# Patient Record
Sex: Male | Born: 1950 | Race: Black or African American | Hispanic: No | Smoking: Former smoker
Health system: Southern US, Community
[De-identification: ages and names within clinical notes are randomized; demographics above are authoritative.]

## PROBLEM LIST (undated history)

## (undated) DIAGNOSIS — N201 Calculus of ureter: Secondary | ICD-10-CM

## (undated) DIAGNOSIS — F419 Anxiety disorder, unspecified: Secondary | ICD-10-CM

## (undated) DIAGNOSIS — N200 Calculus of kidney: Secondary | ICD-10-CM

## (undated) DIAGNOSIS — E7800 Pure hypercholesterolemia, unspecified: Secondary | ICD-10-CM

## (undated) DIAGNOSIS — Z87442 Personal history of urinary calculi: Secondary | ICD-10-CM

## (undated) DIAGNOSIS — S42009A Fracture of unspecified part of unspecified clavicle, initial encounter for closed fracture: Secondary | ICD-10-CM

## (undated) HISTORY — DX: Calculus of kidney: N20.0

## (undated) HISTORY — DX: Anxiety disorder, unspecified: F41.9

## (undated) HISTORY — PX: TONSILLECTOMY: SUR1361

## (undated) HISTORY — DX: Fracture of unspecified part of unspecified clavicle, initial encounter for closed fracture: S42.009A

---

## 1999-07-31 ENCOUNTER — Encounter: Admission: RE | Admit: 1999-07-31 | Discharge: 1999-07-31 | Payer: Self-pay | Admitting: Urology

## 1999-07-31 ENCOUNTER — Encounter: Payer: Self-pay | Admitting: Urology

## 1999-08-06 ENCOUNTER — Encounter: Admission: RE | Admit: 1999-08-06 | Discharge: 1999-08-06 | Payer: Self-pay | Admitting: Urology

## 1999-08-06 ENCOUNTER — Encounter: Payer: Self-pay | Admitting: Urology

## 2015-01-14 ENCOUNTER — Encounter: Payer: Self-pay | Admitting: Family

## 2015-01-14 ENCOUNTER — Other Ambulatory Visit (INDEPENDENT_AMBULATORY_CARE_PROVIDER_SITE_OTHER): Payer: PPO

## 2015-01-14 ENCOUNTER — Ambulatory Visit (INDEPENDENT_AMBULATORY_CARE_PROVIDER_SITE_OTHER): Payer: PPO | Admitting: Family

## 2015-01-14 VITALS — BP 132/86 | HR 67 | Temp 98.7°F | Resp 18 | Ht 62.0 in | Wt 127.4 lb

## 2015-01-14 DIAGNOSIS — F411 Generalized anxiety disorder: Secondary | ICD-10-CM

## 2015-01-14 DIAGNOSIS — R7989 Other specified abnormal findings of blood chemistry: Secondary | ICD-10-CM | POA: Diagnosis not present

## 2015-01-14 DIAGNOSIS — Z136 Encounter for screening for cardiovascular disorders: Secondary | ICD-10-CM

## 2015-01-14 DIAGNOSIS — Z125 Encounter for screening for malignant neoplasm of prostate: Secondary | ICD-10-CM | POA: Diagnosis not present

## 2015-01-14 DIAGNOSIS — Z0000 Encounter for general adult medical examination without abnormal findings: Secondary | ICD-10-CM | POA: Diagnosis not present

## 2015-01-14 DIAGNOSIS — Z23 Encounter for immunization: Secondary | ICD-10-CM

## 2015-01-14 LAB — BASIC METABOLIC PANEL
BUN: 8 mg/dL (ref 6–23)
CO2: 31 mEq/L (ref 19–32)
Calcium: 9.7 mg/dL (ref 8.4–10.5)
Chloride: 102 mEq/L (ref 96–112)
Creatinine, Ser: 1.23 mg/dL (ref 0.40–1.50)
GFR: 62.77 mL/min (ref >60.00)
Glucose, Bld: 101 mg/dL — ABNORMAL HIGH (ref 70–99)
Potassium: 3.8 mEq/L (ref 3.5–5.1)
Sodium: 141 mEq/L (ref 135–145)

## 2015-01-14 LAB — LIPID PANEL
CHOLESTEROL: 310 mg/dL — AB (ref 0–200)
HDL: 62 mg/dL (ref >39.00)
NonHDL: 248
Total CHOL/HDL Ratio: 5
Triglycerides: 228 mg/dL — ABNORMAL HIGH (ref 0.0–149.0)
VLDL: 45.6 mg/dL — ABNORMAL HIGH (ref 0.0–40.0)

## 2015-01-14 LAB — CBC
HCT: 41.3 % (ref 39.0–52.0)
Hemoglobin: 13.6 g/dL (ref 13.0–17.0)
MCHC: 32.9 g/dL (ref 30.0–36.0)
MCV: 85.9 fl (ref 78.0–100.0)
PLATELETS: 220 10*3/uL (ref 150.0–400.0)
RBC: 4.8 Mil/uL (ref 4.22–5.81)
RDW: 15 % (ref 11.5–15.5)
WBC: 8.1 10*3/uL (ref 4.0–10.5)

## 2015-01-14 LAB — LDL CHOLESTEROL, DIRECT: LDL DIRECT: 194 mg/dL

## 2015-01-14 LAB — TSH: TSH: 0.68 u[IU]/mL (ref 0.35–4.50)

## 2015-01-14 LAB — PSA: PSA: 0.68 ng/mL (ref 0.10–4.00)

## 2015-01-14 MED ORDER — ALPRAZOLAM 0.25 MG PO TABS: 0.2500 mg | ORAL_TABLET | Freq: Two times a day (BID) | ORAL | Status: DC | PRN

## 2015-01-14 NOTE — Assessment & Plan Note (Signed)
1) Anticipatory Guidance: Discussed importance of wearing a seatbelt while driving and not texting while driving; changing batteries in smoke detector at least once annually; wearing suntan lotion when outside; eating a balanced and moderate diet; getting physical activity at least 30 minutes per day.  2) Immunizations / Screenings / Labs:  Prevnar and Tetanus updated today. Shingles declined today but is interested. Patient is due for a dental exam which has been scheduled. He will check on his colonoscopy. All other screenings are up to date per recommendations. Obtain CBC, BMET, Lipid profile, PSA and TSH.   Overall well exam. Patient has minimal risk factors for cardiovascular disease. Continue healthful lifestyle choices. Discussed the increased stress he is under caring for his elderly parents and coping with other family situations. Reinforced continued use of coping mechanisms.  Start trial of Xanax. Follow up prevention exam in 1 year. Follow up office visit in 1 month.

## 2015-01-14 NOTE — Progress Notes (Signed)
Pre visit review using our clinic review tool, if applicable. No additional management support is needed unless otherwise documented below in the visit note. 

## 2015-01-14 NOTE — Assessment & Plan Note (Signed)
Reviewed and updated patient's medical, surgical, family and social history. Medications and allergies were also reviewed. Basic screenings for depression, activities of daily living, hearing, cognition and safety were performed. Provider list was updated and health plan was provided to the patient.  

## 2015-01-14 NOTE — Progress Notes (Signed)
Subjective:    Patient ID: Ricardo Chen, male    DOB: 06-16-1951, 64 y.o.   MRN: [id number]  Chief Complaint  Patient presents with  . Establish Care    states that he has stress that feels different the last couple of months, family issues that have come up the last year, parents that he takes care of up in age and other family issues    HPI:  Ricardo Chen is a 64 y.o. male who presents today for an annual wellness visit.   1) Health Maintenance -   Diet - Averages about 2-3 meals per day; meats, whole grains, vegetables, fruits; Caffeine - 1-2 cups per day  Exercise - Walking, jogging, biking and swimming when weather is right.  2) Preventative Exams / Immunizations:  Dental -- Due for exam - will schedule  Vision -- Up to date   Health Maintenance  Topic Date Due  . HIV Screening  01/08/1965  . TETANUS/TDAP  01/08/1969  . COLONOSCOPY  01/09/2000  . ZOSTAVAX  01/08/2010  . PNA vac Low Risk Adult (1 of 2 - PCV13) 01/09/2015  . INFLUENZA VACCINE  04/15/2015  Tetanus today; Prevnar   There is no immunization history on file for this patient.   RISK FACTORS  Tobacco History  Smoking status  . Former Smoker  Smokeless tobacco  . Not on file     Cardiac risk factors: advanced age (older than 50 for men, 33 for women) and male gender.  Depression Screen  Q1: Over the past two weeks, have you felt down, depressed or hopeless? Yes  Q2: Over the past two weeks, have you felt little interest or pleasure in doing things? No  Have you lost interest or pleasure in daily life? No  Do you often feel hopeless? No  Do you cry easily over simple problems? No  Activities of Daily Living In your present state of health, do you have any difficulty performing the following activities?:  Driving? No Managing money?  No Feeding yourself? No Getting from bed to chair? No Climbing a flight of stairs? No Preparing food and eating?: No Bathing or showering?  No Getting dressed: No Getting to the toilet? No Using the toilet: No Moving around from place to place: No In the past year have you fallen or had a near fall?:No   Home Safety Has smoke detector and wears seat belts. No firearms. No excess sun exposure. Are there smokers in your home (other than you)?  No Do you feel safe at home?  Yes  Hearing Difficulties: No Do you often ask people to speak up or repeat themselves? No Do you experience ringing or noises in your ears? No  Do you have difficulty understanding soft or whispered voices? No    Cognitive Testing  Alert? Yes   Normal Appearance? Yes  Oriented to person? Yes  Place? Yes   Time? Yes  Recall of three objects?  Yes  Can perform simple calculations? Yes  Displays appropriate judgment? Yes  Can read the correct time from a watch face? Yes  Do you feel that you have a problem with memory? No  Do you often misplace items? No   Advanced Directives have been discussed with the patient? Yes Has living will.   Current Physicians/Providers and Suppliers  1. Terri Piedra, FNP - PCP/ Primary Care  Indicate any recent Medical Services you may have received from other than [facility] providers in the past year (date may  be approximate).  All answers were reviewed with the patient and necessary referrals were made:  Mauricio Po, [place]   01/14/2015    Review of Systems  Constitutional: Denies fever, chills, fatigue, or significant weight gain/loss. HENT: Head: Denies headache or neck pain Ears: Denies changes in hearing, ringing in ears, earache, drainage Nose: Denies discharge, stuffiness, itching, nosebleed, sinus pain Throat: Denies sore throat, hoarseness, dry mouth, sores, thrush Eyes: Denies loss/changes in vision, pain, redness, blurry/double vision, flashing lights Cardiovascular: Denies chest pain/discomfort, tightness, palpitations, shortness of breath with activity, difficulty lying down, swelling, sudden awakening  with shortness of breath Respiratory: Denies shortness of breath, cough, sputum production, wheezing Gastrointestinal: Denies dysphasia, heartburn, change in appetite, nausea, change in bowel habits, rectal bleeding, constipation, diarrhea, yellow skin or eyes Genitourinary: Denies frequency, urgency, burning/pain, blood in urine, incontinence, change in urinary strength. Musculoskeletal: Denies muscle/joint pain, stiffness, back pain, redness or swelling of joints, trauma Skin: Denies rashes, lumps, itching, dryness, color changes, or hair/nail changes Neurological: Denies dizziness, fainting, seizures, weakness, numbness, tingling, tremor Psychiatric - Denies nervousness, stress, depression or memory loss Notes increased anxiety lately and feels that he was acting differently to it. Notes a type of fear or anger for about the last 6 months. Associated symptoms of increased heart rate is also noted.  Endocrine: Denies heat or cold intolerance, sweating, frequent urination, excessive thirst, changes in appetite Hematologic: Denies ease of bruising or bleeding    Objective:    BP 132/86 mmHg  Pulse 67  Temp(Src) 98.7 F (37.1 C) (Oral)  Resp 18  Ht 5\' 2"  (1.575 m)  Wt 127 lb 6.4 oz (57.788 kg)  BMI 23.30 kg/m2  SpO2 96% Nursing note and vital signs reviewed.  Physical Exam  Constitutional: He is oriented to person, place, and time. He appears well-developed and well-nourished.  HENT:  Head: Normocephalic.  Right Ear: Hearing, tympanic membrane, external ear and ear canal normal.  Left Ear: Hearing, tympanic membrane, external ear and ear canal normal.  Nose: Nose normal.  Mouth/Throat: Uvula is midline, oropharynx is clear and moist and mucous membranes are normal.  Eyes: Conjunctivae and EOM are normal. Pupils are equal, round, and reactive to light.  Neck: Neck supple. No JVD present. No tracheal deviation present. No thyromegaly present.  Cardiovascular: Normal rate, regular  rhythm, normal heart sounds and intact distal pulses.   Pulmonary/Chest: Effort normal and breath sounds normal.  Abdominal: Soft. Bowel sounds are normal. He exhibits no distension and no mass. There is no tenderness. There is no rebound and no guarding.  Musculoskeletal: Normal range of motion. He exhibits no edema or tenderness.  Lymphadenopathy:    He has no cervical adenopathy.  Neurological: He is alert and oriented to person, place, and time. He has normal reflexes. No cranial nerve deficit. He exhibits normal muscle tone. Coordination normal.  Skin: Skin is warm and dry.  Psychiatric: He has a normal mood and affect. His behavior is normal. Judgment and thought content normal.      Assessment & Plan:   During the course of the visit the patient was educated and counseled about appropriate screening and preventive services including:    Pneumococcal vaccine   Prostate cancer screening  Colorectal cancer screening  Nutrition counseling   Diet review for nutrition referral? Yes ____  Not Indicated _X___   Patient Instructions (the written plan) was given to the patient.  Medicare Attestation I have personally reviewed: The patient's medical and social history Their use of  alcohol, tobacco or illicit drugs Their current medications and supplements The patient's functional ability including ADLs,fall risks, home safety risks, cognitive, and hearing and visual impairment Diet and physical activities Evidence for depression or mood disorders  The patient's weight, height, BMI,  have been recorded in the chart.  I have made referrals, counseling, and provided education to the patient based on review of the above and I have provided the patient with a written personalized care plan for preventive services.     Mauricio Po, [place]   01/14/2015

## 2015-01-14 NOTE — Addendum Note (Signed)
Addended by: Delice Bison E on: 01/14/2015 02:05 PM   Modules accepted: Orders

## 2015-01-14 NOTE — Assessment & Plan Note (Signed)
Symptoms consistent with anxiety. Discussed the increased stress he is under caring for his elderly parents and coping with other family situations. Reinforced continued use of coping mechanisms.  Start trial of Xanax.

## 2015-01-14 NOTE — Patient Instructions (Signed)
Thank you for choosing [facility].  Summary/Instructions:  Please stop by the lab on the basement level of the building for your blood work. Your results will be released to [place] (or called to you) after review, usually within 72 hours after test completion. If any changes need to be made, you will be notified at that same time.  If your symptoms worsen or fail to improve, please contact our office for further instruction, or in case of emergency go directly to the emergency room at the closest medical facility.   Health Maintenance A healthy lifestyle and preventative care can promote health and wellness.  Maintain regular health, dental, and eye exams.  Eat a healthy diet. Foods like vegetables, fruits, whole grains, low-fat dairy products, and lean protein foods contain the nutrients you need and are low in calories. Decrease your intake of foods high in solid fats, added sugars, and salt. Get information about a proper diet from your health care provider, if necessary.  Regular physical exercise is one of the most important things you can do for your health. Most adults should get at least 150 minutes of moderate-intensity exercise (any activity that increases your heart rate and causes you to sweat) each week. In addition, most adults need muscle-strengthening exercises on 2 or more days a week.   Maintain a healthy weight. The body mass index (BMI) is a screening tool to identify possible weight problems. It provides an estimate of body fat based on height and weight. Your health care provider can find your BMI and can help you achieve or maintain a healthy weight. For males 20 years and older:  A BMI below 18.5 is considered underweight.  A BMI of 18.5 to 24.9 is normal.  A BMI of 25 to 29.9 is considered overweight.  A BMI of 30 and above is considered obese.  Maintain normal blood lipids and cholesterol by exercising and minimizing your intake of saturated fat. Eat a  balanced diet with plenty of fruits and vegetables. Blood tests for lipids and cholesterol should begin at age 22 and be repeated every 5 years. If your lipid or cholesterol levels are high, you are over age 39, or you are at high risk for heart disease, you may need your cholesterol levels checked more frequently.Ongoing high lipid and cholesterol levels should be treated with medicines if diet and exercise are not working.  If you smoke, find out from your health care provider how to quit. If you do not use tobacco, do not start.  Lung cancer screening is recommended for adults aged 73-80 years who are at high risk for developing lung cancer because of a history of smoking. A yearly low-dose CT scan of the lungs is recommended for people who have at least a 30-pack-year history of smoking and are current smokers or have quit within the past 15 years. A pack year of smoking is smoking an average of 1 pack of cigarettes a day for 1 year (for example, a 30-pack-year history of smoking could mean smoking 1 pack a day for 30 years or 2 packs a day for 15 years). Yearly screening should continue until the smoker has stopped smoking for at least 15 years. Yearly screening should be stopped for people who develop a health problem that would prevent them from having lung cancer treatment.  If you choose to drink alcohol, do not have more than 2 drinks per day. One drink is considered to be 12 oz (360 mL) of beer,  5 oz (150 mL) of wine, or 1.5 oz (45 mL) of liquor.  Avoid the use of street drugs. Do not share needles with anyone. Ask for help if you need support or instructions about stopping the use of drugs.  High blood pressure causes heart disease and increases the risk of stroke. Blood pressure should be checked at least every 1-2 years. Ongoing high blood pressure should be treated with medicines if weight loss and exercise are not effective.  If you are 72-65 years old, ask your health care provider if  you should take aspirin to prevent heart disease.  Diabetes screening involves taking a blood sample to check your fasting blood sugar level. This should be done once every 3 years after age 23 if you are at a normal weight and without risk factors for diabetes. Testing should be considered at a younger age or be carried out more frequently if you are overweight and have at least 1 risk factor for diabetes.  Colorectal cancer can be detected and often prevented. Most routine colorectal cancer screening begins at the age of 51 and continues through age 62. However, your health care provider may recommend screening at an earlier age if you have risk factors for colon cancer. On a yearly basis, your health care provider may provide home test kits to check for hidden blood in the stool. A small camera at the end of a tube may be used to directly examine the colon (sigmoidoscopy or colonoscopy) to detect the earliest forms of colorectal cancer. Talk to your health care provider about this at age 8 when routine screening begins. A direct exam of the colon should be repeated every 5-10 years through age 37, unless early forms of precancerous polyps or small growths are found.  People who are at an increased risk for hepatitis B should be screened for this virus. You are considered at high risk for hepatitis B if:  You were born in a country where hepatitis B occurs often. Talk with your health care provider about which countries are considered high risk.  Your parents were born in a high-risk country and you have not received a shot to protect against hepatitis B (hepatitis B vaccine).  You have HIV or AIDS.  You use needles to inject street drugs.  You live with, or have sex with, someone who has hepatitis B.  You are a man who has sex with other men (MSM).  You get hemodialysis treatment.  You take certain medicines for conditions like cancer, organ transplantation, and autoimmune  conditions.  Hepatitis C blood testing is recommended for all people born from [birth year] through 1966 and any individual with known risk factors for hepatitis C.  Healthy men should no longer receive prostate-specific antigen (PSA) blood tests as part of routine cancer screening. Talk to your health care provider about prostate cancer screening.  Testicular cancer screening is not recommended for adolescents or adult males who have no symptoms. Screening includes self-exam, a health care provider exam, and other screening tests. Consult with your health care provider about any symptoms you have or any concerns you have about testicular cancer.  Practice safe sex. Use condoms and avoid high-risk sexual practices to reduce the spread of sexually transmitted infections (STIs).  You should be screened for STIs, including gonorrhea and chlamydia if:  You are sexually active and are younger than 24 years.  You are older than 24 years, and your health care provider tells you that you are at  risk for this type of infection.  Your sexual activity has changed since you were last screened, and you are at an increased risk for chlamydia or gonorrhea. Ask your health care provider if you are at risk.  If you are at risk of being infected with HIV, it is recommended that you take a prescription medicine daily to prevent HIV infection. This is called pre-exposure prophylaxis (PrEP). You are considered at risk if:  You are a man who has sex with other men (MSM).  You are a heterosexual man who is sexually active with multiple partners.  You take drugs by injection.  You are sexually active with a partner who has HIV.  Talk with your health care provider about whether you are at high risk of being infected with HIV. If you choose to begin PrEP, you should first be tested for HIV. You should then be tested every 3 months for as long as you are taking PrEP.  Use sunscreen. Apply sunscreen liberally and  repeatedly throughout the day. You should seek shade when your shadow is shorter than you. Protect yourself by wearing long sleeves, pants, a wide-brimmed hat, and sunglasses year round whenever you are outdoors.  Tell your health care provider of new moles or changes in moles, especially if there is a change in shape or color. Also, tell your health care provider if a mole is larger than the size of a pencil eraser.  A one-time screening for abdominal aortic aneurysm (AAA) and surgical repair of large AAAs by ultrasound is recommended for men aged 44-75 years who are current or former smokers.  Stay current with your vaccines (immunizations). Document Released: 02/27/2008 Document Revised: 09/05/2013 Document Reviewed: 01/26/2011 [facility] Patient Information 2015 [place], [place]. This information is not intended to replace advice given to you by your health care provider. Make sure you discuss any questions you have with your health care provider.

## 2015-01-15 ENCOUNTER — Telehealth: Payer: Self-pay | Admitting: Family

## 2015-01-15 NOTE — Telephone Encounter (Signed)
Please inform the patient that his blood work his kidney function, electrolytes, white/red blood cells, thyroid and prostate are all within the normal limites. His cholesterol is an area of concern as his bad cholesterol is 248 with a goal being <100. The good news is that the good cholesterol is 62 which helps to reduce this risk. Based on these numbers however, I would recommend starting a cholesterol medication for a period of time while we work to decrease the cholesterol intake and saturated fat intake. If he is in agreement, I will send in a prescription for medication and we can plan to recheck his cholesterol is 6 months.

## 2015-01-16 MED ORDER — PRAVASTATIN SODIUM 40 MG PO TABS: 40.0000 mg | ORAL_TABLET | Freq: Every day | ORAL | Status: DC

## 2015-01-16 NOTE — Telephone Encounter (Signed)
Medication sent to pharmacy  

## 2015-01-16 NOTE — Telephone Encounter (Signed)
Pt is interested in starting the medication. Requested pharmacy is on file. He is also aware of results.

## 2015-01-16 NOTE — Telephone Encounter (Signed)
Pt aware.

## 2015-01-17 ENCOUNTER — Encounter: Payer: Self-pay | Admitting: Internal Medicine

## 2015-02-05 ENCOUNTER — Ambulatory Visit (AMBULATORY_SURGERY_CENTER): Payer: Self-pay | Admitting: *Deleted

## 2015-02-05 VITALS — Ht 63.0 in | Wt 127.0 lb

## 2015-02-05 DIAGNOSIS — Z1211 Encounter for screening for malignant neoplasm of colon: Secondary | ICD-10-CM

## 2015-02-05 MED ORDER — NA SULFATE-K SULFATE-MG SULF 17.5-3.13-1.6 GM/177ML PO SOLN: 1.0000 | Freq: Once | ORAL | Status: DC

## 2015-02-05 NOTE — Progress Notes (Signed)
Denies allergies to eggs or soy products. Denies complications with sedation or anesthesia. Denies O2 use. Denies use of diet or weight loss medications.  Emmi instructions given for colonoscopy.  

## 2015-02-19 ENCOUNTER — Encounter: Payer: Self-pay | Admitting: Internal Medicine

## 2015-02-19 ENCOUNTER — Ambulatory Visit (AMBULATORY_SURGERY_CENTER): Payer: PPO | Admitting: Internal Medicine

## 2015-02-19 VITALS — BP 107/72 | HR 62 | Temp 97.9°F | Resp 19 | Ht 63.0 in | Wt 127.0 lb

## 2015-02-19 DIAGNOSIS — Z1211 Encounter for screening for malignant neoplasm of colon: Secondary | ICD-10-CM | POA: Diagnosis not present

## 2015-02-19 DIAGNOSIS — D125 Benign neoplasm of sigmoid colon: Secondary | ICD-10-CM | POA: Diagnosis not present

## 2015-02-19 DIAGNOSIS — D127 Benign neoplasm of rectosigmoid junction: Secondary | ICD-10-CM

## 2015-02-19 DIAGNOSIS — K635 Polyp of colon: Secondary | ICD-10-CM | POA: Diagnosis not present

## 2015-02-19 MED ORDER — SODIUM CHLORIDE 0.9 % IV SOLN: 500.0000 mL | INTRAVENOUS | Status: DC

## 2015-02-19 NOTE — Patient Instructions (Signed)
YOU HAD AN ENDOSCOPIC PROCEDURE TODAY AT [facility]:   Refer to the procedure report that was given to you for any specific questions about what was found during the examination.  If the procedure report does not answer your questions, please call your gastroenterologist to clarify.  If you requested that your care partner not be given the details of your procedure findings, then the procedure report has been included in a sealed envelope for you to review at your convenience later.  YOU SHOULD EXPECT: Some feelings of bloating in the abdomen. Passage of more gas than usual.  Walking can help get rid of the air that was put into your GI tract during the procedure and reduce the bloating. If you had a lower endoscopy (such as a colonoscopy or flexible sigmoidoscopy) you may notice spotting of blood in your stool or on the toilet paper. If you underwent a bowel prep for your procedure, you may not have a normal bowel movement for a few days.  Please Note:  You might notice some irritation and congestion in your nose or some drainage.  This is from the oxygen used during your procedure.  There is no need for concern and it should clear up in a day or so.  SYMPTOMS TO REPORT IMMEDIATELY:   Following lower endoscopy (colonoscopy or flexible sigmoidoscopy):  Excessive amounts of blood in the stool  Significant tenderness or worsening of abdominal pains  Swelling of the abdomen that is new, acute  Fever of 100F or higher   For urgent or emergent issues, a gastroenterologist can be reached at any hour by calling [phone].   DIET: Your first meal following the procedure should be a small meal and then it is ok to progress to your normal diet. Heavy or fried foods are harder to digest and may make you feel nauseous or bloated.  Likewise, meals heavy in dairy and vegetables can increase bloating.  Drink plenty of fluids but you should avoid alcoholic beverages for 24  hours.  ACTIVITY:  You should plan to take it easy for the rest of today and you should NOT DRIVE or use heavy machinery until tomorrow (because of the sedation medicines used during the test).    FOLLOW UP: Our staff will call the number listed on your records the next business day following your procedure to check on you and address any questions or concerns that you may have regarding the information given to you following your procedure. If we do not reach you, we will leave a message.  However, if you are feeling well and you are not experiencing any problems, there is no need to return our call.  We will assume that you have returned to your regular daily activities without incident.  If any biopsies were taken you will be contacted by phone or by letter within the next 1-3 weeks.  Please call us at [phone] if you have not heard about the biopsies in 3 weeks.    SIGNATURES/CONFIDENTIALITY: You and/or your care partner have signed paperwork which will be entered into your electronic medical record.  These signatures attest to the fact that that the information above on your After Visit Summary has been reviewed and is understood.  Full responsibility of the confidentiality of this discharge information lies with you and/or your care-partner.  Discharge instructions given Polyps/diverticulosis handout with fiber diet Await biopsy report High fiber diet You will receive a letter within 1-2 weeks with results  and recommendations

## 2015-02-19 NOTE — Progress Notes (Signed)
Called to room to assist during endoscopic procedure.  Patient ID and intended procedure confirmed with present staff. Received instructions for my participation in the procedure from the performing physician.  

## 2015-02-19 NOTE — Progress Notes (Signed)
To recovery, report to Smith, RN, VSS 

## 2015-02-19 NOTE — Op Note (Signed)
[place]  [employer]. [place], [id number]   COLONOSCOPY PROCEDURE REPORT  PATIENT: Ricardo Chen, Ricardo Chen  MR#: [id number] BIRTHDATE: 29-Nov-1950 , 59  yrs. old GENDER: male ENDOSCOPIST: Jerene Bears, MD REFERRED BY: Terri Piedra PROCEDURE DATE:  02/19/2015 PROCEDURE:   Colonoscopy, screening and Colonoscopy with snare polypectomy First Screening Colonoscopy - Avg.  risk and is 50 yrs.  old or older Yes.  Prior Negative Screening - Now for repeat screening. N/A  History of Adenoma - Now for follow-up colonoscopy & has been > or = to 3 yrs.  N/A  Polyps removed today? Yes ASA CLASS:   Class II INDICATIONS:Screening for colonic neoplasia and Colorectal Neoplasm Risk Assessment for this procedure is average risk. MEDICATIONS: Monitored anesthesia care and Propofol 250 mg IV  DESCRIPTION OF PROCEDURE:   After the risks benefits and alternatives of the procedure were thoroughly explained, informed consent was obtained.  The digital rectal exam revealed no rectal mass.   The LB Olympus Loaner [id number]  endoscope was introduced through the anus and advanced to the cecum, which was identified by both the appendix and ileocecal valve. No adverse events experienced.   The quality of the prep was (Suprep was used) good. The instrument was then slowly withdrawn as the colon was fully examined. Estimated blood loss is zero unless otherwise noted in this procedure report.   COLON FINDINGS: Two sessile polyps ranging between 3-88mm in size were found in the sigmoid colon and rectosigmoid colon. Polypectomies were performed with a cold snare.  The resection was complete, the polyp tissue was completely retrieved and sent to histology.   There was moderate diverticulosis noted throughout the entire examined colon.  Retroflexed views revealed internal hemorrhoids. The time to cecum = 3.4 Withdrawal time = 14.1   The scope was withdrawn and the procedure completed. COMPLICATIONS:  There were no immediate complications.  ENDOSCOPIC IMPRESSION: 1.   Two sessile polyps ranging between 3-22mm in size were found in the sigmoid colon and rectosigmoid colon; polypectomies were performed with a cold snare 2.   Moderate diverticulosis was noted throughout the entire examined colon  RECOMMENDATIONS: 1.  Await pathology results 2.  High fiber diet 3.  If the polyps removed today are proven to be adenomatous (pre-cancerous) polyps, you will need a repeat colonoscopy in 5 years.  Otherwise you should continue to follow colorectal cancer screening guidelines for "routine risk" patients with colonoscopy in 10 years.  You will receive a letter within 1-2 weeks with the results of your biopsy as well as final recommendations.  Please call my office if you have not received a letter after 3 weeks.  eSigned:  Jerene Bears, MD 02/19/2015 2:13 PM  cc: the patient, Terri Piedra

## 2015-02-20 ENCOUNTER — Telehealth: Payer: Self-pay | Admitting: *Deleted

## 2015-02-20 NOTE — Telephone Encounter (Signed)
  Follow up Call-  Call back number 02/19/2015  Post procedure Call Back phone  # [phone]  Permission to leave phone message Yes     No answer and no answering machine picked up to leave message

## 2015-02-25 ENCOUNTER — Encounter: Payer: Self-pay | Admitting: Internal Medicine

## 2019-11-10 ENCOUNTER — Ambulatory Visit: Payer: Medicare Other | Attending: Internal Medicine

## 2019-11-10 DIAGNOSIS — Z23 Encounter for immunization: Secondary | ICD-10-CM

## 2019-11-10 NOTE — Progress Notes (Signed)
   Covid-19 Vaccination Clinic  Name:  Ricardo Chen    MRN: [id number] DOB: 25-Nov-1950  11/10/2019  Ricardo Chen was observed post Covid-19 immunization for 15 minutes without incidence. He was provided with Vaccine Information Sheet and instruction to access the V-Safe system.   Ricardo Chen was instructed to call 911 with any severe reactions post vaccine: Marland Kitchen Difficulty breathing  . Swelling of your face and throat  . A fast heartbeat  . A bad rash all over your body  . Dizziness and weakness    Immunizations Administered    Name Date Dose VIS Date Route   Pfizer COVID-19 Vaccine 11/10/2019 11:09 AM 0.3 mL 08/25/2019 Intramuscular   Manufacturer: [place]   Lot: [id number]   [place]: [id number]

## 2019-12-05 ENCOUNTER — Ambulatory Visit: Payer: Medicare Other | Attending: Internal Medicine

## 2019-12-05 DIAGNOSIS — Z23 Encounter for immunization: Secondary | ICD-10-CM

## 2019-12-05 NOTE — Progress Notes (Signed)
   Covid-19 Vaccination Clinic  Name:  Ricardo Chen    MRN: [id number] DOB: 12-20-1950  12/05/2019  Ricardo Chen was observed post Covid-19 immunization for 15 minutes without incident. He was provided with Vaccine Information Sheet and instruction to access the V-Safe system.   Ricardo Chen was instructed to call 911 with any severe reactions post vaccine: Marland Kitchen Difficulty breathing  . Swelling of face and throat  . A fast heartbeat  . A bad rash all over body  . Dizziness and weakness   Immunizations Administered    Name Date Dose VIS Date Route   Pfizer COVID-19 Vaccine 12/05/2019  1:34 PM 0.3 mL 08/25/2019 Intramuscular   Manufacturer: [place]   Lot: [id number]   [place]: [id number]

## 2020-01-18 ENCOUNTER — Emergency Department (HOSPITAL_COMMUNITY): Payer: Medicare Other

## 2020-01-18 ENCOUNTER — Encounter (HOSPITAL_COMMUNITY): Payer: Self-pay | Admitting: Emergency Medicine

## 2020-01-18 ENCOUNTER — Other Ambulatory Visit: Payer: Self-pay

## 2020-01-18 ENCOUNTER — Emergency Department (HOSPITAL_COMMUNITY): Payer: Medicare Other | Admitting: Certified Registered Nurse Anesthetist

## 2020-01-18 ENCOUNTER — Observation Stay (HOSPITAL_COMMUNITY)
Admission: EM | Admit: 2020-01-18 | Discharge: 2020-01-19 | Disposition: A | Discharge status: Home / Self Care | Payer: Medicare Other | Attending: Urology | Admitting: Urology

## 2020-01-18 ENCOUNTER — Encounter (HOSPITAL_COMMUNITY)
Admission: EM | Disposition: A | Discharge status: Home / Self Care | Payer: Self-pay | Source: Home / Self Care | Attending: Emergency Medicine

## 2020-01-18 ENCOUNTER — Other Ambulatory Visit: Payer: Self-pay | Admitting: Urology

## 2020-01-18 DIAGNOSIS — N135 Crossing vessel and stricture of ureter without hydronephrosis: Secondary | ICD-10-CM

## 2020-01-18 DIAGNOSIS — Z87891 Personal history of nicotine dependence: Secondary | ICD-10-CM | POA: Insufficient documentation

## 2020-01-18 DIAGNOSIS — N1330 Unspecified hydronephrosis: Secondary | ICD-10-CM

## 2020-01-18 DIAGNOSIS — R7989 Other specified abnormal findings of blood chemistry: Secondary | ICD-10-CM

## 2020-01-18 DIAGNOSIS — N132 Hydronephrosis with renal and ureteral calculous obstruction: Secondary | ICD-10-CM | POA: Diagnosis not present

## 2020-01-18 DIAGNOSIS — Z87442 Personal history of urinary calculi: Secondary | ICD-10-CM | POA: Insufficient documentation

## 2020-01-18 DIAGNOSIS — Z20822 Contact with and (suspected) exposure to covid-19: Secondary | ICD-10-CM | POA: Insufficient documentation

## 2020-01-18 DIAGNOSIS — Z419 Encounter for procedure for purposes other than remedying health state, unspecified: Secondary | ICD-10-CM

## 2020-01-18 DIAGNOSIS — F419 Anxiety disorder, unspecified: Secondary | ICD-10-CM | POA: Diagnosis not present

## 2020-01-18 HISTORY — PX: CYSTOSCOPY W/ URETERAL STENT PLACEMENT: SHX1429

## 2020-01-18 LAB — CBC
HCT: 46.8 % (ref 39.0–52.0)
Hemoglobin: 14.8 g/dL (ref 13.0–17.0)
MCH: 28 pg (ref 26.0–34.0)
MCHC: 31.6 g/dL (ref 30.0–36.0)
MCV: 88.6 fL (ref 80.0–100.0)
Platelets: 239 10*3/uL (ref 150–400)
RBC: 5.28 MIL/uL (ref 4.22–5.81)
RDW: 15.7 % — ABNORMAL HIGH (ref 11.5–15.5)
WBC: 13.7 10*3/uL — ABNORMAL HIGH (ref 4.0–10.5)
nRBC: 0 % (ref 0.0–0.2)

## 2020-01-18 LAB — COMPREHENSIVE METABOLIC PANEL
ALT: 20 U/L (ref 0–44)
AST: 30 U/L (ref 15–41)
Albumin: 4.5 g/dL (ref 3.5–5.0)
Alkaline Phosphatase: 68 U/L (ref 38–126)
Anion gap: 10 (ref 5–15)
BUN: 12 mg/dL (ref 8–23)
CO2: 30 mmol/L (ref 22–32)
Calcium: 10 mg/dL (ref 8.9–10.3)
Chloride: 99 mmol/L (ref 98–111)
Creatinine, Ser: 1.7 mg/dL — ABNORMAL HIGH (ref 0.61–1.24)
GFR calc Af Amer: 46 mL/min — ABNORMAL LOW (ref >60)
GFR calc non Af Amer: 40 mL/min — ABNORMAL LOW (ref >60)
Glucose, Bld: 165 mg/dL — ABNORMAL HIGH (ref 70–99)
Potassium: 3.6 mmol/L (ref 3.5–5.1)
Sodium: 139 mmol/L (ref 135–145)
Total Bilirubin: 0.6 mg/dL (ref 0.3–1.2)
Total Protein: 7.7 g/dL (ref 6.5–8.1)

## 2020-01-18 LAB — URINALYSIS, ROUTINE W REFLEX MICROSCOPIC
Bilirubin Urine: NEGATIVE
Glucose, UA: NEGATIVE mg/dL
Ketones, ur: NEGATIVE mg/dL
Nitrite: NEGATIVE
Protein, ur: 30 mg/dL — AB
RBC / HPF: >50 RBC/hpf — ABNORMAL HIGH (ref 0–5)
Specific Gravity, Urine: 1.016 (ref 1.005–1.030)
pH: 7 (ref 5.0–8.0)

## 2020-01-18 LAB — RESPIRATORY PANEL BY RT PCR (FLU A&B, COVID)
Influenza A by PCR: NEGATIVE
Influenza B by PCR: NEGATIVE
SARS Coronavirus 2 by RT PCR: NEGATIVE

## 2020-01-18 LAB — LIPASE, BLOOD: Lipase: 59 U/L — ABNORMAL HIGH (ref 11–51)

## 2020-01-18 SURGERY — CYSTOSCOPY, WITH RETROGRADE PYELOGRAM AND URETERAL STENT INSERTION
Anesthesia: General | Laterality: Bilateral

## 2020-01-18 MED ORDER — IOHEXOL 300 MG/ML  SOLN
100.0000 mL | Freq: Once | INTRAMUSCULAR | Status: AC | PRN
  Administered 2020-01-18: 100 mL via INTRAVENOUS

## 2020-01-18 MED ORDER — IOHEXOL 300 MG/ML  SOLN
INTRAMUSCULAR | Status: DC | PRN
  Administered 2020-01-18: 10 mL

## 2020-01-18 MED ORDER — OXYCODONE-ACETAMINOPHEN 5-325 MG PO TABS
1.0000 | ORAL_TABLET | ORAL | Status: DC | PRN
  Administered 2020-01-19: 1 via ORAL
  Filled 2020-01-18: qty 1

## 2020-01-18 MED ORDER — DIPHENHYDRAMINE HCL 12.5 MG/5ML PO ELIX: 12.5000 mg | ORAL_SOLUTION | Freq: Four times a day (QID) | ORAL | Status: DC | PRN

## 2020-01-18 MED ORDER — ONDANSETRON HCL 4 MG/2ML IJ SOLN
INTRAMUSCULAR | Status: DC | PRN
  Administered 2020-01-18: 4 mg via INTRAVENOUS

## 2020-01-18 MED ORDER — ONDANSETRON HCL 4 MG/2ML IJ SOLN: 4.0000 mg | INTRAMUSCULAR | Status: DC | PRN

## 2020-01-18 MED ORDER — DEXAMETHASONE SODIUM PHOSPHATE 10 MG/ML IJ SOLN
INTRAMUSCULAR | Status: DC | PRN
  Administered 2020-01-18: 5 mg via INTRAVENOUS

## 2020-01-18 MED ORDER — FENTANYL CITRATE (PF) 250 MCG/5ML IJ SOLN
INTRAMUSCULAR | Status: AC
  Filled 2020-01-18: qty 5

## 2020-01-18 MED ORDER — SODIUM CHLORIDE 0.9% FLUSH: 3.0000 mL | Freq: Once | INTRAVENOUS | Status: DC

## 2020-01-18 MED ORDER — PHENYLEPHRINE 40 MCG/ML (10ML) SYRINGE FOR IV PUSH (FOR BLOOD PRESSURE SUPPORT)
PREFILLED_SYRINGE | INTRAVENOUS | Status: DC | PRN
  Administered 2020-01-18 (×4): 80 ug via INTRAVENOUS

## 2020-01-18 MED ORDER — FENTANYL CITRATE (PF) 250 MCG/5ML IJ SOLN
INTRAMUSCULAR | Status: DC | PRN
  Administered 2020-01-18: 25 ug via INTRAVENOUS
  Administered 2020-01-18: 50 ug via INTRAVENOUS

## 2020-01-18 MED ORDER — CEFAZOLIN SODIUM-DEXTROSE 1-4 GM/50ML-% IV SOLN
INTRAVENOUS | Status: DC | PRN
  Administered 2020-01-18: 2 g via INTRAVENOUS

## 2020-01-18 MED ORDER — LIDOCAINE 2% (20 MG/ML) 5 ML SYRINGE
INTRAMUSCULAR | Status: DC | PRN
  Administered 2020-01-18: 60 mg via INTRAVENOUS

## 2020-01-18 MED ORDER — ZOLPIDEM TARTRATE 5 MG PO TABS
5.0000 mg | ORAL_TABLET | Freq: Every evening | ORAL | Status: DC | PRN
  Administered 2020-01-19: 5 mg via ORAL
  Filled 2020-01-18: qty 1

## 2020-01-18 MED ORDER — SUGAMMADEX SODIUM 200 MG/2ML IV SOLN
INTRAVENOUS | Status: DC | PRN
  Administered 2020-01-18: 200 mg via INTRAVENOUS

## 2020-01-18 MED ORDER — 0.9 % SODIUM CHLORIDE (POUR BTL) OPTIME
TOPICAL | Status: DC | PRN
  Administered 2020-01-18: 17:00:00 1000 mL

## 2020-01-18 MED ORDER — SODIUM CHLORIDE 0.9 % IV BOLUS
1000.0000 mL | Freq: Once | INTRAVENOUS | Status: AC
  Administered 2020-01-18: 1000 mL via INTRAVENOUS

## 2020-01-18 MED ORDER — HYDROMORPHONE HCL 1 MG/ML IJ SOLN: 0.5000 mg | INTRAMUSCULAR | Status: DC | PRN

## 2020-01-18 MED ORDER — SODIUM CHLORIDE 0.9 % IV SOLN: INTRAVENOUS | Status: DC

## 2020-01-18 MED ORDER — LIDOCAINE HCL URETHRAL/MUCOSAL 2 % EX GEL
CUTANEOUS | Status: AC
  Filled 2020-01-18: qty 30

## 2020-01-18 MED ORDER — LACTATED RINGERS IV SOLN: INTRAVENOUS | Status: DC | PRN

## 2020-01-18 MED ORDER — KETOROLAC TROMETHAMINE 15 MG/ML IJ SOLN
INTRAMUSCULAR | Status: DC | PRN
  Administered 2020-01-18: 15 mg via INTRAVENOUS

## 2020-01-18 MED ORDER — PROPOFOL 10 MG/ML IV BOLUS
INTRAVENOUS | Status: AC
  Filled 2020-01-18: qty 20

## 2020-01-18 MED ORDER — SUCCINYLCHOLINE CHLORIDE 20 MG/ML IJ SOLN
INTRAMUSCULAR | Status: DC | PRN
  Administered 2020-01-18: 80 mg via INTRAVENOUS

## 2020-01-18 MED ORDER — BELLADONNA ALKALOIDS-OPIUM 16.2-60 MG RE SUPP
1.0000 | RECTAL | Status: DC
  Filled 2020-01-18: qty 1

## 2020-01-18 MED ORDER — ROCURONIUM BROMIDE 10 MG/ML (PF) SYRINGE
PREFILLED_SYRINGE | INTRAVENOUS | Status: DC | PRN
  Administered 2020-01-18: 40 mg via INTRAVENOUS

## 2020-01-18 MED ORDER — FENTANYL CITRATE (PF) 100 MCG/2ML IJ SOLN: 25.0000 ug | INTRAMUSCULAR | Status: DC | PRN

## 2020-01-18 MED ORDER — DIPHENHYDRAMINE HCL 50 MG/ML IJ SOLN
12.5000 mg | Freq: Four times a day (QID) | INTRAMUSCULAR | Status: DC | PRN
  Filled 2020-01-18: qty 1

## 2020-01-18 MED ORDER — PROPOFOL 10 MG/ML IV BOLUS
INTRAVENOUS | Status: DC | PRN
  Administered 2020-01-18 (×2): 100 mg via INTRAVENOUS

## 2020-01-18 SURGICAL SUPPLY — 24 items
BAG DRN RND TRDRP ANRFLXCHMBR (UROLOGICAL SUPPLIES) ×1
BAG URINE DRAIN 2000ML AR STRL (UROLOGICAL SUPPLIES) ×3 IMPLANT
BAG URO CATCHER STRL LF (MISCELLANEOUS) ×3 IMPLANT
CATH FOLEY 2WAY SLVR  5CC 16FR (CATHETERS)
CATH FOLEY 2WAY SLVR 5CC 16FR (CATHETERS) IMPLANT
CATH INTERMIT  6FR 70CM (CATHETERS) ×3 IMPLANT
GLOVE BIO SURGEON STRL SZ8 (GLOVE) ×3 IMPLANT
GOWN STRL REUS W/ TWL LRG LVL3 (GOWN DISPOSABLE) ×1 IMPLANT
GOWN STRL REUS W/ TWL XL LVL3 (GOWN DISPOSABLE) ×1 IMPLANT
GOWN STRL REUS W/TWL LRG LVL3 (GOWN DISPOSABLE) ×3
GOWN STRL REUS W/TWL XL LVL3 (GOWN DISPOSABLE) ×3
GUIDEWIRE ANG ZIPWIRE 038X150 (WIRE) ×2 IMPLANT
GUIDEWIRE STR DUAL SENSOR (WIRE) ×3 IMPLANT
KIT TURNOVER KIT B (KITS) ×3 IMPLANT
MANIFOLD NEPTUNE II (INSTRUMENTS) IMPLANT
NS IRRIG 1000ML POUR BTL (IV SOLUTION) IMPLANT
PACK CYSTO (CUSTOM PROCEDURE TRAY) ×3 IMPLANT
STENT URET 6FRX24 CONTOUR (STENTS) IMPLANT
STENT URET 6FRX26 CONTOUR (STENTS) ×6 IMPLANT
SYPHON OMNI JUG (MISCELLANEOUS) ×3 IMPLANT
TOWEL GREEN STERILE FF (TOWEL DISPOSABLE) ×3 IMPLANT
TUBE CONNECTING 12'X1/4 (SUCTIONS)
TUBE CONNECTING 12X1/4 (SUCTIONS) IMPLANT
WATER STERILE IRR 3000ML UROMA (IV SOLUTION) ×3 IMPLANT

## 2020-01-18 NOTE — Op Note (Signed)
Preoperative diagnosis: bilateral ureteral calculi  Postoperative diagnosis: Same  Procedure: 1 cystoscopy 2. Bilateral retrograde pyelography 3.  Intraoperative fluoroscopy, under one hour, with interpretation 4.  left 6 x 26 JJ stent placement  Attending: Rosie Fate  Anesthesia: General  Estimated blood loss: None  Drains: left 6 x 26 JJ ureteral stent without tether  Specimens: none  Antibiotics: ancef  Findings: Bilateral UPJ calculi. Moderate left hydronephrosis. Mild right hydronephrosis No masses/lesions in the bladder. Ureteral orifices in normal anatomic location. Unable to place rigth ureteral stent.  Indications: Patient is a 69 year old male with a history of bilateral ureteral stone and elevated creatinine. After discussing treatment options, they decided proceed with bilateral stent placement.  Procedure her in detail: The patient was brought to the operating room and a brief timeout was done to ensure correct patient, correct procedure, correct site.  General anesthesia was administered patient was placed in dorsal lithotomy position.  Her genitalia was then prepped and draped in usual sterile fashion.  A rigid 74 French cystoscope was passed in the urethra and the bladder.  Bladder was inspected free masses or lesions.  the ureteral orifices were in the normal orthotopic locations. a 6 french ureteral catheter was then instilled into the left ureteral orifice.  a gentle retrograde was obtained and findings noted above. We then advanced a zipwire up to the renal pelvis. We then placed a 6 x 26 double-j ureteral stent over the zip wire. We then removed the wire and good coil was noted in the the renal pelvis under fluoroscopy and the bladder under direct vision.  We then turned out attention to the right side.  a gentle retrograde was obtained and findings noted above. We then advanced a zipwire up to the UPJ calculus but was unable to pass the wire past the calculus.  Multiple attempts were made with a zipwire, sensor wire and ureteral catheter which were unsuccessful.  the bladder was then drained and this concluded the procedure which was well tolerated by patient.  Complications: None  Condition: Stable, extubated, transferred to PACU  Plan: Patient is to be admitted. NPO after midnight for possible right nephrostomy tube placement tomorrow

## 2020-01-18 NOTE — ED Triage Notes (Signed)
Pt reports LRQ pain that began yesterday morning. Reports he took an immodium even though he wasn't having diarrhea. States that he ate something yesterday and pain subsided, upon waking this morning, pain had returned. Reports vomiting today.

## 2020-01-18 NOTE — ED Notes (Signed)
Urine culture collected with UA °

## 2020-01-18 NOTE — H&P (Signed)
Urology Admission H&P  Chief Complaint: left flank pain  History of Present Illness: [name] is a 69yo with a remote history of nephrolithiasis who presented to the ER this morning with a 1 week hx of severe left flank pain. The pain is sharp, intermittent, severe and nonradiaiting. He has associated nausea but no vomiting. Ct obtained in the ER shows a 1.7cm right UPJ calculus and a left 1cm calculus. Creatinine 1.7. last stone event 20 years ago. No other associated symptoms. No exacerbating/alleviaitng events.   Past Medical History:  Diagnosis Date  . Anxiety   . Collar bone fracture 1995  . Kidney stones    Past Surgical History:  Procedure Laterality Date  . TONSILLECTOMY      Home Medications:  Current Facility-Administered Medications  Medication Dose Route Frequency Provider Last Rate Last Admin  . opium-belladonna (B&O) suppository 16.2-60mg   1 suppository Rectal To OR Jerry Clyne, Candee Furbish, MD      . Doug Sou Hold] sodium chloride flush (NS) 0.9 % injection 3 mL  3 mL Intravenous Once Trifan, Carola Rhine, MD       Allergies: No Known Allergies  Family History  Problem Relation Age of Onset  . Heart failure Mother   . Colon cancer Father   . Heart disease Father    Social History:  reports that he quit smoking about 40 years ago. He has a 1.00 pack-year smoking history. He has never used smokeless tobacco. He reports current alcohol use of about 2.0 standard drinks of alcohol per week. He reports that he does not use drugs.  Review of Systems  Constitutional: Negative for fever.  Genitourinary: Positive for flank pain.  All other systems reviewed and are negative.   Physical Exam:  Vital signs in last 24 hours: Temp:  [98.7 F (37.1 C)] 98.7 F (37.1 C) (05/06 0842) Pulse Rate:  [61-85] 85 (05/06 1300) Resp:  [18] 18 (05/06 0842) BP: (110-149)/(67-91) 126/76 (05/06 1300) SpO2:  [95 %-100 %] 97 % (05/06 1300) Weight:  [59 kg] 59 kg (05/06 0842) Physical Exam   Constitutional: He is oriented to person, place, and time. He appears well-developed and well-nourished.  HENT:  Head: Normocephalic and atraumatic.  Eyes: Pupils are equal, round, and reactive to light. EOM are normal.  Neck: No thyromegaly present.  Cardiovascular: Normal rate and regular rhythm.  Respiratory: Effort normal. No respiratory distress.  GI: Soft. He exhibits no distension.  Musculoskeletal:        General: No edema. Normal range of motion.     Cervical back: Normal range of motion.  Neurological: He is alert and oriented to person, place, and time.  Skin: Skin is warm and dry.  Psychiatric: He has a normal mood and affect. His behavior is normal. Judgment and thought content normal.    Laboratory Data:  Results for orders placed or performed during the hospital encounter of 01/18/20 (from the past 24 hour(s))  Lipase, blood     Status: Abnormal   Collection Time: 01/18/20  8:52 AM  Result Value Ref Range   Lipase 59 (H) 11 - 51 U/L  Comprehensive metabolic panel     Status: Abnormal   Collection Time: 01/18/20  8:52 AM  Result Value Ref Range   Sodium 139 135 - 145 mmol/L   Potassium 3.6 3.5 - 5.1 mmol/L   Chloride 99 98 - 111 mmol/L   CO2 30 22 - 32 mmol/L   Glucose, Bld 165 (H) 70 - 99 mg/dL  BUN 12 8 - 23 mg/dL   Creatinine, Ser 1.70 (H) 0.61 - 1.24 mg/dL   Calcium 10.0 8.9 - 10.3 mg/dL   Total Protein 7.7 6.5 - 8.1 g/dL   Albumin 4.5 3.5 - 5.0 g/dL   AST 30 15 - 41 U/L   ALT 20 0 - 44 U/L   Alkaline Phosphatase 68 38 - 126 U/L   Total Bilirubin 0.6 0.3 - 1.2 mg/dL   GFR calc non Af Amer 40 (L) >60 mL/min   GFR calc Af Amer 46 (L) >60 mL/min   Anion gap 10 5 - 15  CBC     Status: Abnormal   Collection Time: 01/18/20  8:52 AM  Result Value Ref Range   WBC 13.7 (H) 4.0 - 10.5 K/uL   RBC 5.28 4.22 - 5.81 MIL/uL   Hemoglobin 14.8 13.0 - 17.0 g/dL   HCT 46.8 39.0 - 52.0 %   MCV 88.6 80.0 - 100.0 fL   MCH 28.0 26.0 - 34.0 pg   MCHC 31.6 30.0 - 36.0  g/dL   RDW 15.7 (H) 11.5 - 15.5 %   Platelets 239 150 - 400 K/uL   nRBC 0.0 0.0 - 0.2 %  Urinalysis, Routine w reflex microscopic     Status: Abnormal   Collection Time: 01/18/20 12:23 PM  Result Value Ref Range   Color, Urine YELLOW YELLOW   APPearance HAZY (A) CLEAR   Specific Gravity, Urine 1.016 1.005 - 1.030   pH 7.0 5.0 - 8.0   Glucose, UA NEGATIVE NEGATIVE mg/dL   Hgb urine dipstick LARGE (A) NEGATIVE   Bilirubin Urine NEGATIVE NEGATIVE   Ketones, ur NEGATIVE NEGATIVE mg/dL   Protein, ur 30 (A) NEGATIVE mg/dL   Nitrite NEGATIVE NEGATIVE   Leukocytes,Ua SMALL (A) NEGATIVE   RBC / HPF >50 (H) 0 - 5 RBC/hpf   WBC, UA 21-50 0 - 5 WBC/hpf   Bacteria, UA RARE (A) NONE SEEN   Squamous Epithelial / LPF 0-5 0 - 5   Mucus PRESENT   Respiratory Panel by RT PCR (Flu A&B, Covid) - Nasopharyngeal Swab     Status: None   Collection Time: 01/18/20  1:09 PM   Specimen: Nasopharyngeal Swab  Result Value Ref Range   SARS Coronavirus 2 by RT PCR NEGATIVE NEGATIVE   Influenza A by PCR NEGATIVE NEGATIVE   Influenza B by PCR NEGATIVE NEGATIVE   Recent Results (from the past 240 hour(s))  Respiratory Panel by RT PCR (Flu A&B, Covid) - Nasopharyngeal Swab     Status: None   Collection Time: 01/18/20  1:09 PM   Specimen: Nasopharyngeal Swab  Result Value Ref Range Status   SARS Coronavirus 2 by RT PCR NEGATIVE NEGATIVE Final    Comment: (NOTE) SARS-CoV-2 target nucleic acids are NOT DETECTED. The SARS-CoV-2 RNA is generally detectable in upper respiratoy specimens during the acute phase of infection. The lowest concentration of SARS-CoV-2 viral copies this assay can detect is 131 copies/mL. A negative result does not preclude SARS-Cov-2 infection and should not be used as the sole basis for treatment or other patient management decisions. A negative result may occur with  improper specimen collection/handling, submission of specimen other than nasopharyngeal swab, presence of viral  mutation(s) within the areas targeted by this assay, and inadequate number of viral copies (<131 copies/mL). A negative result must be combined with clinical observations, patient history, and epidemiological information. The expected result is Negative. Fact Sheet for Patients:  PinkCheek.be Fact Sheet for  Healthcare Providers:  GravelBags.it This test is not yet ap proved or cleared by the [place] and  has been authorized for detection and/or diagnosis of SARS-CoV-2 by FDA under an Emergency Use Authorization (EUA). This EUA will remain  in effect (meaning this test can be used) for the duration of the COVID-19 declaration under Section 564(b)(1) of the Act, 21 U.S.C. section 360bbb-3(b)(1), unless the authorization is terminated or revoked sooner.    Influenza A by PCR NEGATIVE NEGATIVE Final   Influenza B by PCR NEGATIVE NEGATIVE Final    Comment: (NOTE) The Xpert Xpress SARS-CoV-2/FLU/RSV assay is intended as an aid in  the diagnosis of influenza from Nasopharyngeal swab specimens and  should not be used as a sole basis for treatment. Nasal washings and  aspirates are unacceptable for Xpert Xpress SARS-CoV-2/FLU/RSV  testing. Fact Sheet for Patients: PinkCheek.be Fact Sheet for Healthcare Providers: GravelBags.it This test is not yet approved or cleared by the [place] FDA and  has been authorized for detection and/or diagnosis of SARS-CoV-2 by  FDA under an Emergency Use Authorization (EUA). This EUA will remain  in effect (meaning this test can be used) for the duration of the  Covid-19 declaration under Section 564(b)(1) of the Act, 21  U.S.C. section 360bbb-3(b)(1), unless the authorization is  terminated or revoked. Performed at [facility], [place] [address] [place], [place] [address]    Creatinine: Recent Labs    01/18/20 [id number]   CREATININE 1.70*   Baseline Creatinine: 1  Impression/Assessment:  69yo with bilateral ureteral calculi  Plan:  The risks/benefits/alterantives to bilateral ureteral stetn placement was explained to the patient and he understands and wishes to proceed with surgery  Nicolette Bang 01/18/2020, 4:58 PM

## 2020-01-18 NOTE — ED Provider Notes (Signed)
[facility] EMERGENCY DEPARTMENT Provider Note   CSN: [id number] Arrival date & time: 01/18/20  [id number]     History Chief Complaint  Patient presents with  . Abdominal Pain    Ricardo Chen is a 69 y.o. male history kidney stones presented emergency department left lower quadrant abdominal pain.  He reports the pain began yesterday morning when he woke up.  Describing a soreness in his left lower abdomen.  He took some Imodium yesterday.  The pain subsequently went away and he went about his daily activities, including mowing the lawn.  He did report some nausea initially and an episode of dry heaving when the pain began yesterday, but that all improved.  This morning however he woke up again with recurrent pain in his left lower quadrant.  He again took another Imodium.  Decided to come to the emergency department.  He says he has not had pain like this before.  Currently the pain is very minimal.  It does not radiate anywhere.  He tells me several days prior to this episode, he was having diarrhea and loose bowel movements.  Having 5-6 episodes of diarrhea per day.  He says it does not feel like his kidney stones, which he most recently had 15 years ago.  He denies dysuria or hematuria.  He denies fevers.  He reports he may have had some chills yesterday.  He denies any respiratory symptoms, cough or congestion.  He did receive both doses of his COVID vaccine.  He said he has had a colonoscopy before and been never been told he has diverticulosis or diverticulitis.  He denies smoking history or any history of aneurysm.  No past abdominal surgical history.  HPI     Past Medical History:  Diagnosis Date  . Anxiety   . Collar bone fracture 1995  . Kidney stones     Patient Active Problem List   Diagnosis Date Noted  . Routine general medical examination at a health care facility 01/14/2015  . Medicare welcome exam 01/14/2015  . Anxiety state 01/14/2015     Past Surgical History:  Procedure Laterality Date  . TONSILLECTOMY         Family History  Problem Relation Age of Onset  . Heart failure Mother   . Colon cancer Father   . Heart disease Father     Social History   Tobacco Use  . Smoking status: Former Smoker    Packs/day: 0.10    Years: 10.00    Pack years: 1.00    Quit date: 09/15/1979    Years since quitting: 40.3  . Smokeless tobacco: Never Used  Substance Use Topics  . Alcohol use: Yes    Alcohol/week: 2.0 standard drinks    Types: 2 Cans of beer per week  . Drug use: No    Comment: Previous hx of marijuna usage    Home Medications Prior to Admission medications   Medication Sig Start Date End Date Taking? Authorizing Provider  cholecalciferol (VITAMIN D3) 25 MCG (1000 UNIT) tablet Take 1,000 Units by mouth daily.   Yes [provider]  ferrous sulfate 325 (65 FE) MG tablet Take 325 mg by mouth daily with breakfast.   Yes [provider]  psyllium (METAMUCIL SMOOTH TEXTURE) 28 % packet Take 1 packet by mouth daily as needed (constipation).   Yes [provider]  vitamin B-12 (CYANOCOBALAMIN) 1000 MCG tablet Take 1,000 mcg by mouth daily.   Yes [provider]  zinc gluconate 50 MG tablet Take 50 mg by mouth daily.   Yes [provider]  ALPRAZolam (XANAX) 0.25 MG tablet Take 1 tablet (0.25 mg total) by mouth 2 (two) times daily as needed for anxiety. Patient not taking: Reported on 01/18/2020 01/14/15   Golden Circle, FNP  pravastatin (PRAVACHOL) 40 MG tablet Take 1 tablet (40 mg total) by mouth daily. Patient not taking: Reported on 01/18/2020 01/16/15   Golden Circle, FNP    Allergies    Patient has no known allergies.  Review of Systems   Review of Systems  Constitutional: Positive for chills. Negative for fever.  HENT: Negative for ear pain and sore throat.   Eyes: Negative for pain and visual disturbance.  Respiratory: Negative for cough and shortness of  breath.   Cardiovascular: Negative for chest pain and palpitations.  Gastrointestinal: Positive for abdominal pain, nausea and vomiting. Negative for constipation and diarrhea.  Genitourinary: Negative for dysuria and hematuria.  Musculoskeletal: Negative for arthralgias and back pain.  Skin: Negative for color change and rash.  Neurological: Negative for syncope, light-headedness and headaches.  All other systems reviewed and are negative.   Physical Exam Updated Vital Signs BP 126/76   Pulse 85   Temp 98.7 F (37.1 C) (Oral)   Resp 18   Ht 5\' 3"  (1.6 m)   Wt 59 kg   SpO2 97%   BMI 23.03 kg/m   Physical Exam Vitals and nursing note reviewed.  Constitutional:      Appearance: He is well-developed.  HENT:     Head: Normocephalic and atraumatic.  Eyes:     Conjunctiva/sclera: Conjunctivae normal.  Cardiovascular:     Rate and Rhythm: Normal rate and regular rhythm.     Heart sounds: No murmur.  Pulmonary:     Effort: Pulmonary effort is normal. No respiratory distress.     Breath sounds: Normal breath sounds.  Abdominal:     Palpations: Abdomen is soft.     Tenderness: There is no abdominal tenderness. There is no right CVA tenderness, left CVA tenderness, guarding or rebound. Negative signs include Murphy's sign, Rovsing's sign and McBurney's sign.  Musculoskeletal:     Cervical back: Neck supple.  Skin:    General: Skin is warm and dry.  Neurological:     General: No focal deficit present.     Mental Status: He is alert and oriented to person, place, and time.  Psychiatric:        Mood and Affect: Mood normal.        Behavior: Behavior normal.     ED Results / Procedures / Treatments   Labs (all labs ordered are listed, but only abnormal results are displayed) Labs Reviewed  LIPASE, BLOOD - Abnormal; Notable for the following components:      Result Value   Lipase 59 (*)    All other components within normal limits  COMPREHENSIVE METABOLIC PANEL -  Abnormal; Notable for the following components:   Glucose, Bld 165 (*)    Creatinine, Ser 1.70 (*)    GFR calc non Af Amer 40 (*)    GFR calc Af Amer 46 (*)    All other components within normal limits  CBC - Abnormal; Notable for the following components:   WBC 13.7 (*)    RDW 15.7 (*)    All other components within normal limits  URINALYSIS, ROUTINE W REFLEX MICROSCOPIC - Abnormal; Notable for the following components:   APPearance HAZY (*)  Hgb urine dipstick LARGE (*)    Protein, ur 30 (*)    Leukocytes,Ua SMALL (*)    RBC / HPF >50 (*)    Bacteria, UA RARE (*)    All other components within normal limits  RESPIRATORY PANEL BY RT PCR (FLU A&B, COVID)    EKG None  Radiology CT ABDOMEN PELVIS W CONTRAST  Result Date: 01/18/2020 CLINICAL DATA:  Left lower quadrant pain for 2 days with diverticulitis suspected EXAM: CT ABDOMEN AND PELVIS WITH CONTRAST TECHNIQUE: Multidetector CT imaging of the abdomen and pelvis was performed using the standard protocol following bolus administration of intravenous contrast. CONTRAST:  167mL OMNIPAQUE IOHEXOL 300 MG/ML  SOLN COMPARISON:  None available FINDINGS: Lower chest:  Mild dependent atelectasis Hepatobiliary: No focal liver abnormality.No evidence of biliary obstruction or stone. Pancreas: Unremarkable. Spleen: Unremarkable. Adrenals/Urinary Tract: Negative adrenals. Left hydronephrosis and perinephric stranding due to a 8 mm UPJ calculus. There is also right hydronephrosis from a 17 x 11 mm UPJ calculus. There is right-sided cortical thinning and no right-sided hydronephrosis, suspect this obstructing stone is chronic. 4 mm lower pole calculus on the right. Unremarkable bladder. Stomach/Bowel: No obstruction. Colonic diverticulosis. No evidence of diverticulitis. Vascular/Lymphatic: No acute vascular abnormality. No mass or adenopathy. Reproductive:Enlarged prostate projecting upward into the bladder base. Other: No ascites or pneumoperitoneum.  Musculoskeletal: No acute abnormalities. Disc bulging and lower lumbar facet spurring. IMPRESSION: 1. Obstructing 8 mm left UPJ calculus, acute appearing. 2. Obstructing 17 x 11 mm right UVJ calculus-likely chronic given there is cortical thinning and a lack of perinephric edema. 3. 4 mm right renal calculus. Electronically Signed   By: Monte Fantasia M.D.   On: 01/18/2020 11:51    Procedures Procedures (including critical care time)  Medications Ordered in ED Medications  sodium chloride flush (NS) 0.9 % injection 3 mL ( Intravenous MAR Hold 01/18/20 1527)  opium-belladonna (B&O) suppository 16.2-60mg  (has no administration in time range)  0.9 % irrigation (POUR BTL) (1,000 mLs Irrigation Given 01/18/20 1710)  sodium chloride 0.9 % bolus 1,000 mL (0 mLs Intravenous Stopped 01/18/20 1221)  iohexol (OMNIPAQUE) 300 MG/ML solution 100 mL (100 mLs Intravenous Contrast Given 01/18/20 1136)    ED Course  I have reviewed the triage vital signs and the nursing notes.  Pertinent labs & imaging results that were available during my care of the patient were reviewed by me and considered in my medical decision making (see chart for details).  This patient complains of abdominal pain .  This involves an extensive number of treatment options, and is a complaint that carries with it a high risk of complications and morbidity.  The differential diagnosis includes diverticulitis vs. Constipation vs ileus vs kidney stone vs other  Less likely AAA with no clear risk factors (ie smoking, family history), no syncope or lightheadedness, no pulsatile abdominal mass  No palpable hernia on exam Less likely abdominal perforation or surgical emergency at this time.  He has a benign abdominal exam, no evidence of peritonitis  I ordered, reviewed, and interpreted labs, which included CMP, Lipase, CBC, UA I ordered medication IV fluids for renal prehydration for CT contrast scan I ordered imaging studies which included CT  abdomen pelvis with contrast I independently visualized and interpreted imaging which showed bilateral obstructing ureteral stones and hydronephrosis and the monitor tracing which showed NSR  I consulted urology and discussed lab and imaging findings  BMP notable for worsening Cr, now 1.7, with lowering GFR, perhaps related to chronic obstruction.  Discussed this with the urologist who will evaluate patient for possible stenting.   Clinical Course as of Jan 18 1743  Thu Jan 18, 2020  1208 IMPRESSION: 1. Obstructing 8 mm left UPJ calculus, acute appearing. 2. Obstructing 17 x 11 mm right UVJ calculus-likely chronic given there is cortical thinning and a lack of perinephric edema. 3. 4 mm right renal calculus   [MT]  1215 Informed about CT results, he is pain free now.  Will discuss with urology, UA pending   [MT]  1255 UA reviewed, less likely infectious at this time, paged urology   [MT]  [place] to dr Alyson Ingles who believes this may need a stent toda.y  He requested NPO status and covid screen, they will come see patient   [MT]    Clinical Course User Index [MT] Langston Masker, Carola Rhine, MD    Final Clinical Impression(s) / ED Diagnoses Final diagnoses:  Ureteral stone with hydronephrosis  Hydronephrosis of left kidney  Hydronephrosis of right kidney  Elevated serum creatinine    Rx / DC Orders ED Discharge Orders    None       Wyvonnia Dusky, MD 01/18/20 1746

## 2020-01-18 NOTE — Anesthesia Preprocedure Evaluation (Addendum)
Anesthesia Evaluation  Patient identified by MRN, date of birth, ID band Patient awake    Reviewed: Allergy & Precautions, H&P , NPO status , Patient's Chart, lab work & pertinent test results  Airway Mallampati: II  TM Distance: >3 FB Neck ROM: Full    Dental no notable dental hx. (+) Poor Dentition, Dental Advisory Given   Pulmonary neg pulmonary ROS, former smoker,    Pulmonary exam normal breath sounds clear to auscultation       Cardiovascular negative cardio ROS   Rhythm:Regular Rate:Normal     Neuro/Psych Anxiety negative neurological ROS     GI/Hepatic negative GI ROS, Neg liver ROS,   Endo/Other  negative endocrine ROS  Renal/GU Renal disease  negative genitourinary   Musculoskeletal   Abdominal   Peds  Hematology negative hematology ROS (+)   Anesthesia Other Findings   Reproductive/Obstetrics negative OB ROS                            Anesthesia Physical Anesthesia Plan  ASA: II  Anesthesia Plan: General   Post-op Pain Management:    Induction: Intravenous  PONV Risk Score and Plan: 3 and Ondansetron, Dexamethasone and Midazolam  Airway Management Planned: LMA  Additional Equipment:   Intra-op Plan:   Post-operative Plan: Extubation in OR  Informed Consent: I have reviewed the patients History and Physical, chart, labs and discussed the procedure including the risks, benefits and alternatives for the proposed anesthesia with the patient or authorized representative who has indicated his/her understanding and acceptance.     Dental advisory given  Plan Discussed with: CRNA  Anesthesia Plan Comments:         Anesthesia Quick Evaluation

## 2020-01-18 NOTE — Transfer of Care (Signed)
Immediate Anesthesia Transfer of Care Note  Patient: Ricardo Chen  Procedure(s) Performed: CYSTOSCOPY WITH BILATERAL RETROGRADE PYELOGRAM, left URETERAL STENT PLACEMENT, attempted right ureteral stent placement (Bilateral )  Patient Location: PACU  Anesthesia Type:General  Level of Consciousness: drowsy  Airway & Oxygen Therapy: Patient Spontanous Breathing and Patient connected to nasal cannula oxygen  Post-op Assessment: Report given to RN, Post -op Vital signs reviewed and stable and Patient moving all extremities  Post vital signs: Reviewed and stable  Last Vitals:  Vitals Value Taken Time  BP 140/80 01/18/20 1928  Temp 36.4 C 01/18/20 1830  Pulse 71 01/18/20 1935  Resp 21 01/18/20 1935  SpO2 97 % 01/18/20 1935  Vitals shown include unvalidated device data.  Last Pain:  Vitals:   01/18/20 1830  TempSrc:   PainSc: 0-No pain         Complications: No apparent anesthesia complications

## 2020-01-18 NOTE — Anesthesia Procedure Notes (Signed)
Procedure Name: Intubation Date/Time: 01/18/2020 5:25 PM Performed by: Chanci Ojala T, CRNA Pre-anesthesia Checklist: Patient identified, Emergency Drugs available, Suction available and Patient being monitored Patient Re-evaluated:Patient Re-evaluated prior to induction Oxygen Delivery Method: Circle system utilized Preoxygenation: Pre-oxygenation with 100% oxygen Induction Type: IV induction Ventilation: Mask ventilation without difficulty Laryngoscope Size: Mac and 4 Grade View: Grade II Tube type: Oral Tube size: 7.5 mm Number of attempts: 1 Airway Equipment and Method: Stylet and Oral airway Placement Confirmation: ETT inserted through vocal cords under direct vision,  positive ETCO2 and breath sounds checked- equal and bilateral Secured at: 22 cm Tube secured with: Tape Dental Injury: Teeth and Oropharynx as per pre-operative assessment  Comments: Failed w LMA 5. Transition to OETT

## 2020-01-18 NOTE — Anesthesia Postprocedure Evaluation (Signed)
Anesthesia Post Note  Patient: MAHITH DORNBOS  Procedure(s) Performed: CYSTOSCOPY WITH BILATERAL RETROGRADE PYELOGRAM, left URETERAL STENT PLACEMENT, attempted right ureteral stent placement (Bilateral )     Patient location during evaluation: PACU Anesthesia Type: General Level of consciousness: awake and alert Pain management: pain level controlled Vital Signs Assessment: post-procedure vital signs reviewed and stable Respiratory status: spontaneous breathing, nonlabored ventilation, respiratory function stable and patient connected to nasal cannula oxygen Cardiovascular status: blood pressure returned to baseline and stable Postop Assessment: no apparent nausea or vomiting Anesthetic complications: no    Last Vitals:  Vitals:   01/18/20 1830 01/18/20 1845  BP: 130/82 134/83  Pulse: 79 66  Resp: 20 10  Temp: (!) 36.4 C   SpO2: 97% 98%    Last Pain:  Vitals:   01/18/20 1830  TempSrc:   PainSc: 0-No pain                 Maurilio Puryear,W. EDMOND

## 2020-01-19 ENCOUNTER — Encounter (HOSPITAL_COMMUNITY): Payer: Self-pay | Admitting: Urology

## 2020-01-19 HISTORY — PX: CYSTOSCOPY: SUR368

## 2020-01-19 LAB — BASIC METABOLIC PANEL
Anion gap: 7 (ref 5–15)
BUN: 15 mg/dL (ref 8–23)
CO2: 25 mmol/L (ref 22–32)
Calcium: 8.7 mg/dL — ABNORMAL LOW (ref 8.9–10.3)
Chloride: 108 mmol/L (ref 98–111)
Creatinine, Ser: 1.79 mg/dL — ABNORMAL HIGH (ref 0.61–1.24)
GFR calc Af Amer: 44 mL/min — ABNORMAL LOW (ref >60)
GFR calc non Af Amer: 38 mL/min — ABNORMAL LOW (ref >60)
Glucose, Bld: 125 mg/dL — ABNORMAL HIGH (ref 70–99)
Potassium: 3.9 mmol/L (ref 3.5–5.1)
Sodium: 140 mmol/L (ref 135–145)

## 2020-01-19 LAB — CBC
HCT: 38.4 % — ABNORMAL LOW (ref 39.0–52.0)
Hemoglobin: 12.7 g/dL — ABNORMAL LOW (ref 13.0–17.0)
MCH: 28.7 pg (ref 26.0–34.0)
MCHC: 33.1 g/dL (ref 30.0–36.0)
MCV: 86.9 fL (ref 80.0–100.0)
Platelets: 199 10*3/uL (ref 150–400)
RBC: 4.42 MIL/uL (ref 4.22–5.81)
RDW: 15.5 % (ref 11.5–15.5)
WBC: 12.7 10*3/uL — ABNORMAL HIGH (ref 4.0–10.5)
nRBC: 0 % (ref 0.0–0.2)

## 2020-01-19 MED ORDER — OXYCODONE-ACETAMINOPHEN 5-325 MG PO TABS: 1.0000 | ORAL_TABLET | ORAL | 0 refills | Status: DC | PRN

## 2020-01-19 MED ORDER — TAMSULOSIN HCL 0.4 MG PO CAPS: 0.4000 mg | ORAL_CAPSULE | Freq: Every day | ORAL | 1 refills | Status: DC

## 2020-01-19 NOTE — Progress Notes (Signed)
DISCHARGE NOTE HOME Ricardo Chen to be discharged Home per MD order. Discussed prescriptions and follow up appointments with the patient. Prescriptions given to patient; medication list explained in detail. Patient verbalized understanding.  Skin clean, dry and intact without evidence of skin break down, no evidence of skin tears noted. IV catheter discontinued intact. Site without signs and symptoms of complications. Dressing and pressure applied. Pt denies pain at the site currently. No complaints noted.  Patient free of lines, drains, and wounds.   An After Visit Summary (AVS) was printed and given to the patient. Patient escorted via wheelchair, and discharged home via private auto.

## 2020-01-19 NOTE — Discharge Instructions (Signed)

## 2020-02-06 NOTE — Discharge Summary (Signed)
Physician Discharge Summary  Patient ID: Ricardo Chen MRN: [id number] DOB/AGE: 19-Jun-1951 69 y.o.  Admit date: 01/18/2020 Discharge date: 01/19/2020  Admission Diagnoses:  Bilateral ureteral calculi Discharge Diagnoses:  Active Problems:   Bilateral ureteral obstruction   Past Medical History:  Diagnosis Date  . Anxiety   . Collar bone fracture 1995  . Kidney stones     Surgeries: Procedure(s): CYSTOSCOPY WITH BILATERAL RETROGRADE PYELOGRAM, left URETERAL STENT PLACEMENT, attempted right ureteral stent placement on 01/18/2020   Consultants (if any):   Discharged Condition: Improved  Hospital Course: Ricardo Chen is an 69 y.o. male who was admitted 01/18/2020 with a diagnosis of <principal problem not specified> and went to the operating room on 01/18/2020 and underwent the above named procedures.    He was given perioperative antibiotics:  Anti-infectives (From admission, onward)   None    .  He was given sequential compression devices, early ambulation for DVT prophylaxis.  He benefited maximally from the hospital stay and there were no complications.    Recent vital signs:  Vitals:   01/19/20 0607 01/19/20 0821  BP: 122/70 112/66  Pulse: 70 64  Resp: 16 16  Temp: 99.1 F (37.3 C) 99.5 F (37.5 C)  SpO2: 95% 97%    Recent laboratory studies:  Lab Results  Component Value Date   HGB 12.7 (L) 01/19/2020   HGB 14.8 01/18/2020   HGB 13.6 01/14/2015   Lab Results  Component Value Date   WBC 12.7 (H) 01/19/2020   PLT 199 01/19/2020   No results found for: INR Lab Results  Component Value Date   NA 140 01/19/2020   K 3.9 01/19/2020   CL 108 01/19/2020   CO2 25 01/19/2020   BUN 15 01/19/2020   CREATININE 1.79 (H) 01/19/2020   GLUCOSE 125 (H) 01/19/2020    Discharge Medications:   Allergies as of 01/19/2020   No Known Allergies     Medication List    TAKE these medications   ALPRAZolam 0.25 MG tablet Commonly known as: XANAX Take 1 tablet  (0.25 mg total) by mouth 2 (two) times daily as needed for anxiety.   cholecalciferol 25 MCG (1000 UNIT) tablet Commonly known as: VITAMIN D3 Take 1,000 Units by mouth daily.   ferrous sulfate 325 (65 FE) MG tablet Take 325 mg by mouth daily with breakfast.   oxyCODONE-acetaminophen 5-325 MG tablet Commonly known as: PERCOCET/ROXICET Take 1-2 tablets by mouth every 4 (four) hours as needed for moderate pain.   pravastatin 40 MG tablet Commonly known as: PRAVACHOL Take 1 tablet (40 mg total) by mouth daily.   psyllium 28 % packet Commonly known as: METAMUCIL SMOOTH TEXTURE Take 1 packet by mouth daily as needed (constipation).   tamsulosin 0.4 MG Caps capsule Commonly known as: Flomax Take 1 capsule (0.4 mg total) by mouth daily after supper.   vitamin B-12 1000 MCG tablet Commonly known as: CYANOCOBALAMIN Take 1,000 mcg by mouth daily.   zinc gluconate 50 MG tablet Take 50 mg by mouth daily.       Diagnostic Studies: CT ABDOMEN PELVIS W CONTRAST  Result Date: 01/18/2020 CLINICAL DATA:  Left lower quadrant pain for 2 days with diverticulitis suspected EXAM: CT ABDOMEN AND PELVIS WITH CONTRAST TECHNIQUE: Multidetector CT imaging of the abdomen and pelvis was performed using the standard protocol following bolus administration of intravenous contrast. CONTRAST:  159mL OMNIPAQUE IOHEXOL 300 MG/ML  SOLN COMPARISON:  None available FINDINGS: Lower chest:  Mild dependent atelectasis Hepatobiliary: No  focal liver abnormality.No evidence of biliary obstruction or stone. Pancreas: Unremarkable. Spleen: Unremarkable. Adrenals/Urinary Tract: Negative adrenals. Left hydronephrosis and perinephric stranding due to a 8 mm UPJ calculus. There is also right hydronephrosis from a 17 x 11 mm UPJ calculus. There is right-sided cortical thinning and no right-sided hydronephrosis, suspect this obstructing stone is chronic. 4 mm lower pole calculus on the right. Unremarkable bladder. Stomach/Bowel: No  obstruction. Colonic diverticulosis. No evidence of diverticulitis. Vascular/Lymphatic: No acute vascular abnormality. No mass or adenopathy. Reproductive:Enlarged prostate projecting upward into the bladder base. Other: No ascites or pneumoperitoneum. Musculoskeletal: No acute abnormalities. Disc bulging and lower lumbar facet spurring. IMPRESSION: 1. Obstructing 8 mm left UPJ calculus, acute appearing. 2. Obstructing 17 x 11 mm right UVJ calculus-likely chronic given there is cortical thinning and a lack of perinephric edema. 3. 4 mm right renal calculus. Electronically Signed   By: Monte Fantasia M.D.   On: 01/18/2020 11:51   DG Retrograde Pyelogram  Result Date: 01/18/2020 CLINICAL DATA:  Bilateral retrograde pyelogram with left-sided stent placement and attempted right-sided stent placement. EXAM: RETROGRADE PYELOGRAM COMPARISON:  CT dated Jan 18, 2020 FINDINGS: On the images submitted, the patient's left and right sides were not distinguished. As a stent is visualized on the third image this is favored to be the patient's left side as indicated in the history. There is injection of contrast which demonstrates a filling defect within the proximal left ureter. Subsequent images demonstrate a left-sided ureteral stent terminating in a left upper pole calyx. Subsequent images demonstrate catheterization of the right ureter with injection of contrast. This demonstrates right-sided hydronephrosis. There appears to be a filling defect at the right UPJ likely representing the stone. No right-sided ureteral stent was noted. The total fluoroscopy time was 2 minutes and 12 seconds. Seven images were submitted for evaluation. IMPRESSION: Status post urologic intervention as detailed above. Please see separate operative report for complete details. Electronically Signed   By: Constance Holster M.D.   On: 01/18/2020 18:56    Disposition: Discharge disposition: 01-Home or Self Care       Discharge Instructions     Discharge patient   Complete by: As directed    Discharge disposition: 01-Home or Self Care   Discharge patient date: 01/19/2020      Follow-up Information    Relena Ivancic, Candee Furbish, MD. Call in 1 week(s).   Specialty: Urology Contact information: [address] [place] [address] [phone]            Signed: Nicolette Bang 02/06/2020, 4:15 PM

## 2020-02-07 ENCOUNTER — Other Ambulatory Visit: Payer: Self-pay | Admitting: Urology

## 2020-02-23 ENCOUNTER — Encounter (HOSPITAL_BASED_OUTPATIENT_CLINIC_OR_DEPARTMENT_OTHER): Payer: Self-pay | Admitting: Urology

## 2020-02-27 ENCOUNTER — Other Ambulatory Visit: Payer: Self-pay

## 2020-02-27 ENCOUNTER — Encounter (HOSPITAL_BASED_OUTPATIENT_CLINIC_OR_DEPARTMENT_OTHER): Payer: Self-pay | Admitting: Urology

## 2020-02-27 ENCOUNTER — Other Ambulatory Visit (HOSPITAL_COMMUNITY)
Admission: RE | Admit: 2020-02-27 | Discharge: 2020-02-27 | Disposition: A | Discharge status: Home / Self Care | Payer: Medicare Other | Source: Ambulatory Visit | Attending: Urology | Admitting: Urology

## 2020-02-27 DIAGNOSIS — Z01812 Encounter for preprocedural laboratory examination: Secondary | ICD-10-CM | POA: Insufficient documentation

## 2020-02-27 DIAGNOSIS — Z20822 Contact with and (suspected) exposure to covid-19: Secondary | ICD-10-CM | POA: Diagnosis not present

## 2020-02-27 LAB — SARS CORONAVIRUS 2 (TAT 6-24 HRS): SARS Coronavirus 2: NEGATIVE

## 2020-02-27 NOTE — Progress Notes (Signed)
Spoke w/ via phone for pre-op interview--- Mliss Fritz Lab needs dos----     NONE         Lab results------ COVID test ------02/27/20 Arrive at -------0530 NPO after ------MN Medications to take morning of surgery ----- Oxycodone if needed. Diabetic medication -----n/a Patient Special Instructions -----n/a Pre-Op special Istructions ----- Patient verbalized understanding of instructions that were given at this phone interview. Patient denies shortness of breath, chest pain, fever, cough a this phone interview.

## 2020-02-29 ENCOUNTER — Encounter (HOSPITAL_BASED_OUTPATIENT_CLINIC_OR_DEPARTMENT_OTHER): Payer: Self-pay | Admitting: Anesthesiology

## 2020-02-29 NOTE — Anesthesia Preprocedure Evaluation (Deleted)
Anesthesia Evaluation    Airway        Dental   Pulmonary former smoker,           Cardiovascular hypertension,      Neuro/Psych    GI/Hepatic   Endo/Other    Renal/GU      Musculoskeletal   Abdominal   Peds  Hematology   Anesthesia Other Findings   Reproductive/Obstetrics                             Anesthesia Physical Anesthesia Plan  ASA: II  Anesthesia Plan: General   Post-op Pain Management:    Induction: Intravenous  PONV Risk Score and Plan:   Airway Management Planned:   Additional Equipment:   Intra-op Plan:   Post-operative Plan:   Informed Consent:   Plan Discussed with:   Anesthesia Plan Comments:         Anesthesia Quick Evaluation

## 2020-03-01 ENCOUNTER — Encounter (HOSPITAL_BASED_OUTPATIENT_CLINIC_OR_DEPARTMENT_OTHER)
Admission: RE | Disposition: A | Discharge status: Home / Self Care | Payer: Self-pay | Source: Home / Self Care | Attending: Urology

## 2020-03-01 ENCOUNTER — Ambulatory Visit (HOSPITAL_BASED_OUTPATIENT_CLINIC_OR_DEPARTMENT_OTHER)
Admission: RE | Admit: 2020-03-01 | Discharge: 2020-03-01 | Disposition: A | Discharge status: Home / Self Care | Payer: Medicare Other | Attending: Urology | Admitting: Urology

## 2020-03-01 ENCOUNTER — Encounter (HOSPITAL_BASED_OUTPATIENT_CLINIC_OR_DEPARTMENT_OTHER): Payer: Self-pay | Admitting: Urology

## 2020-03-01 HISTORY — DX: Pure hypercholesterolemia, unspecified: E78.00

## 2020-03-01 HISTORY — DX: Calculus of ureter: N20.1

## 2020-03-01 HISTORY — DX: Personal history of urinary calculi: Z87.442

## 2020-03-01 SURGERY — CYSTOURETEROSCOPY, WITH RETROGRADE PYELOGRAM AND STENT INSERTION
Anesthesia: General

## 2020-03-01 MED ORDER — LACTATED RINGERS IV SOLN: INTRAVENOUS | Status: DC

## 2020-03-01 MED ORDER — CEFAZOLIN SODIUM-DEXTROSE 2-4 GM/100ML-% IV SOLN: 2.0000 g | INTRAVENOUS | Status: DC

## 2020-03-01 MED ORDER — CEFAZOLIN SODIUM-DEXTROSE 2-4 GM/100ML-% IV SOLN
INTRAVENOUS | Status: AC
  Filled 2020-03-01: qty 100

## 2020-03-01 SURGICAL SUPPLY — 27 items
BAG DRAIN URO-CYSTO SKYTR STRL (DRAIN) ×1 IMPLANT
BAG DRN UROCATH (DRAIN)
BASKET STONE 1.7 NGAGE (UROLOGICAL SUPPLIES) IMPLANT
CATH INTERMIT  6FR 70CM (CATHETERS) IMPLANT
CLOTH BEACON ORANGE TIMEOUT ST (SAFETY) ×1 IMPLANT
EVACUATOR MICROVAS BLADDER (UROLOGICAL SUPPLIES) IMPLANT
EXTRACTOR STONE 1.7FRX115CM (UROLOGICAL SUPPLIES) IMPLANT
FIBER LASER FLEXIVA 1000 (UROLOGICAL SUPPLIES) IMPLANT
FIBER LASER FLEXIVA 365 (UROLOGICAL SUPPLIES) IMPLANT
FIBER LASER FLEXIVA 550 (UROLOGICAL SUPPLIES) IMPLANT
FIBER LASER TRAC TIP (UROLOGICAL SUPPLIES) IMPLANT
GLOVE BIO SURGEON STRL SZ8 (GLOVE) ×1 IMPLANT
GOWN STRL REUS W/TWL XL LVL3 (GOWN DISPOSABLE) ×1 IMPLANT
GUIDEWIRE STR DUAL SENSOR (WIRE) IMPLANT
GUIDEWIRE ZIPWRE .038 STRAIGHT (WIRE) ×1 IMPLANT
IV NS 1000ML (IV SOLUTION)
IV NS 1000ML BAXH (IV SOLUTION) ×1 IMPLANT
IV NS IRRIG 3000ML ARTHROMATIC (IV SOLUTION) ×1 IMPLANT
KIT TURNOVER CYSTO (KITS) ×1 IMPLANT
MANIFOLD NEPTUNE II (INSTRUMENTS) ×1 IMPLANT
NS IRRIG 500ML POUR BTL (IV SOLUTION) ×1 IMPLANT
STENT URET 6FRX26 CONTOUR (STENTS) IMPLANT
SYR 10ML LL (SYRINGE) ×1 IMPLANT
TRAY CYSTO PACK (CUSTOM PROCEDURE TRAY) ×1 IMPLANT
TUBE CONNECTING 12'X1/4 (SUCTIONS)
TUBE CONNECTING 12X1/4 (SUCTIONS) IMPLANT
TUBING UROLOGY SET (TUBING) ×1 IMPLANT

## 2020-03-01 NOTE — Progress Notes (Signed)
Called and talked with Ricardo Chen, a friend will pick him up at 1000

## 2020-03-04 ENCOUNTER — Other Ambulatory Visit: Payer: Self-pay | Admitting: Urology

## 2020-03-10 ENCOUNTER — Other Ambulatory Visit: Payer: Self-pay | Admitting: Urology

## 2020-03-11 ENCOUNTER — Other Ambulatory Visit: Payer: Self-pay

## 2020-03-11 ENCOUNTER — Encounter (HOSPITAL_BASED_OUTPATIENT_CLINIC_OR_DEPARTMENT_OTHER): Payer: Self-pay | Admitting: Urology

## 2020-03-11 NOTE — Progress Notes (Addendum)
Spoke w/ via phone for pre-op interview---pateint Lab needs dos----none              COVID test ------03-12-2020 at 955 am Arrive at -------530 am 03-15-2020 No food after midnight, water from midnight until 430 am then npo Medications to take morning of surgery -----none Diabetic medication -----n/a Patient Special Instructions -----none Pre-Op special Istructions -----none Patient verbalized understanding of instructions that were given at this phone interview. Patient denies shortness of breath, chest pain, fever, cough a this phone interview.  Patient states he never started 81 mg aspirin back.

## 2020-03-12 ENCOUNTER — Other Ambulatory Visit (HOSPITAL_COMMUNITY)
Admission: RE | Admit: 2020-03-12 | Discharge: 2020-03-12 | Disposition: A | Discharge status: Home / Self Care | Payer: Medicare Other | Source: Ambulatory Visit | Attending: Urology | Admitting: Urology

## 2020-03-12 DIAGNOSIS — Z20822 Contact with and (suspected) exposure to covid-19: Secondary | ICD-10-CM | POA: Diagnosis not present

## 2020-03-12 DIAGNOSIS — Z01812 Encounter for preprocedural laboratory examination: Secondary | ICD-10-CM | POA: Diagnosis present

## 2020-03-12 LAB — SARS CORONAVIRUS 2 (TAT 6-24 HRS): SARS Coronavirus 2: NEGATIVE

## 2020-03-14 NOTE — Anesthesia Preprocedure Evaluation (Addendum)
Anesthesia Evaluation  Patient identified by MRN, date of birth, ID band Patient awake    Reviewed: Allergy & Precautions, NPO status , Patient's Chart, lab work & pertinent test results  Airway Mallampati: II  TM Distance: >3 FB Neck ROM: Full    Dental no notable dental hx. (+) Dental Advisory Given, Poor Dentition,    Pulmonary former smoker,    Pulmonary exam normal breath sounds clear to auscultation       Cardiovascular negative cardio ROS Normal cardiovascular exam Rhythm:Regular Rate:Normal     Neuro/Psych Anxiety negative neurological ROS     GI/Hepatic negative GI ROS, Neg liver ROS,   Endo/Other  negative endocrine ROS  Renal/GU negative Renal ROS     Musculoskeletal negative musculoskeletal ROS (+)   Abdominal   Peds  Hematology Lab Results      Component                Value               Date                      WBC                      12.7 (H)            01/19/2020                HGB                      12.7 (L)            01/19/2020                HCT                      38.4 (L)            01/19/2020                MCV                      86.9                01/19/2020                PLT                      199                 01/19/2020              Anesthesia Other Findings   Reproductive/Obstetrics negative OB ROS                           Anesthesia Physical Anesthesia Plan  ASA: II  Anesthesia Plan: General   Post-op Pain Management:    Induction: Intravenous  PONV Risk Score and Plan: 3 and Treatment may vary due to age or medical condition, Ondansetron, Dexamethasone and Midazolam  Airway Management Planned: LMA  Additional Equipment: None  Intra-op Plan:   Post-operative Plan:   Informed Consent: I have reviewed the patients History and Physical, chart, labs and discussed the procedure including the risks, benefits and alternatives for the  proposed anesthesia with the patient or authorized representative who has indicated his/her understanding and acceptance.     Dental advisory  given  Plan Discussed with:   Anesthesia Plan Comments:        Anesthesia Quick Evaluation

## 2020-03-15 ENCOUNTER — Ambulatory Visit (HOSPITAL_BASED_OUTPATIENT_CLINIC_OR_DEPARTMENT_OTHER): Payer: Medicare Other | Admitting: Anesthesiology

## 2020-03-15 ENCOUNTER — Encounter (HOSPITAL_BASED_OUTPATIENT_CLINIC_OR_DEPARTMENT_OTHER)
Admission: RE | Disposition: A | Discharge status: Home / Self Care | Payer: Self-pay | Source: Home / Self Care | Attending: Urology

## 2020-03-15 ENCOUNTER — Other Ambulatory Visit: Payer: Self-pay

## 2020-03-15 ENCOUNTER — Encounter (HOSPITAL_BASED_OUTPATIENT_CLINIC_OR_DEPARTMENT_OTHER): Payer: Self-pay | Admitting: Urology

## 2020-03-15 ENCOUNTER — Ambulatory Visit (HOSPITAL_BASED_OUTPATIENT_CLINIC_OR_DEPARTMENT_OTHER)
Admission: RE | Admit: 2020-03-15 | Discharge: 2020-03-15 | Disposition: A | Discharge status: Home / Self Care | Payer: Medicare Other | Attending: Urology | Admitting: Urology

## 2020-03-15 DIAGNOSIS — Z87442 Personal history of urinary calculi: Secondary | ICD-10-CM | POA: Insufficient documentation

## 2020-03-15 DIAGNOSIS — N202 Calculus of kidney with calculus of ureter: Secondary | ICD-10-CM | POA: Insufficient documentation

## 2020-03-15 DIAGNOSIS — N200 Calculus of kidney: Secondary | ICD-10-CM | POA: Diagnosis present

## 2020-03-15 DIAGNOSIS — Z87891 Personal history of nicotine dependence: Secondary | ICD-10-CM | POA: Diagnosis not present

## 2020-03-15 HISTORY — PX: CYSTOSCOPY WITH RETROGRADE PYELOGRAM, URETEROSCOPY AND STENT PLACEMENT: SHX5789

## 2020-03-15 HISTORY — PX: HOLMIUM LASER APPLICATION: SHX5852

## 2020-03-15 SURGERY — CYSTOURETEROSCOPY, WITH RETROGRADE PYELOGRAM AND STENT INSERTION
Anesthesia: General | Site: Pelvis | Laterality: Left

## 2020-03-15 MED ORDER — WHITE PETROLATUM EX OINT
TOPICAL_OINTMENT | CUTANEOUS | Status: AC
  Filled 2020-03-15: qty 5

## 2020-03-15 MED ORDER — PROPOFOL 10 MG/ML IV BOLUS
INTRAVENOUS | Status: DC | PRN
  Administered 2020-03-15: 50 mg via INTRAVENOUS
  Administered 2020-03-15: 100 mg via INTRAVENOUS

## 2020-03-15 MED ORDER — FENTANYL CITRATE (PF) 100 MCG/2ML IJ SOLN: 25.0000 ug | INTRAMUSCULAR | Status: DC | PRN

## 2020-03-15 MED ORDER — FENTANYL CITRATE (PF) 100 MCG/2ML IJ SOLN
INTRAMUSCULAR | Status: AC
  Filled 2020-03-15: qty 2

## 2020-03-15 MED ORDER — LIDOCAINE HCL (CARDIAC) PF 100 MG/5ML IV SOSY
PREFILLED_SYRINGE | INTRAVENOUS | Status: DC | PRN
  Administered 2020-03-15: 100 mg via INTRAVENOUS

## 2020-03-15 MED ORDER — LIDOCAINE 2% (20 MG/ML) 5 ML SYRINGE
INTRAMUSCULAR | Status: AC
  Filled 2020-03-15: qty 5

## 2020-03-15 MED ORDER — TAMSULOSIN HCL 0.4 MG PO CAPS: 0.4000 mg | ORAL_CAPSULE | Freq: Every day | ORAL | 1 refills | Status: DC

## 2020-03-15 MED ORDER — LACTATED RINGERS IV SOLN: INTRAVENOUS | Status: DC

## 2020-03-15 MED ORDER — PROPOFOL 10 MG/ML IV BOLUS
INTRAVENOUS | Status: AC
  Filled 2020-03-15: qty 40

## 2020-03-15 MED ORDER — DEXAMETHASONE SODIUM PHOSPHATE 10 MG/ML IJ SOLN
INTRAMUSCULAR | Status: DC | PRN
  Administered 2020-03-15: 10 mg via INTRAVENOUS

## 2020-03-15 MED ORDER — ONDANSETRON HCL 4 MG/2ML IJ SOLN
INTRAMUSCULAR | Status: AC
  Filled 2020-03-15: qty 2

## 2020-03-15 MED ORDER — DEXAMETHASONE SODIUM PHOSPHATE 10 MG/ML IJ SOLN
INTRAMUSCULAR | Status: AC
  Filled 2020-03-15: qty 1

## 2020-03-15 MED ORDER — ONDANSETRON HCL 4 MG/2ML IJ SOLN
INTRAMUSCULAR | Status: DC | PRN
  Administered 2020-03-15: 4 mg via INTRAVENOUS

## 2020-03-15 MED ORDER — FENTANYL CITRATE (PF) 100 MCG/2ML IJ SOLN
INTRAMUSCULAR | Status: DC | PRN
  Administered 2020-03-15 (×4): 25 ug via INTRAVENOUS

## 2020-03-15 MED ORDER — OXYCODONE-ACETAMINOPHEN 5-325 MG PO TABS: 1.0000 | ORAL_TABLET | ORAL | 0 refills | Status: AC | PRN

## 2020-03-15 MED ORDER — CEFAZOLIN SODIUM-DEXTROSE 2-4 GM/100ML-% IV SOLN
2.0000 g | INTRAVENOUS | Status: AC
  Administered 2020-03-15: 2 g via INTRAVENOUS

## 2020-03-15 MED ORDER — CEFAZOLIN SODIUM-DEXTROSE 2-4 GM/100ML-% IV SOLN
INTRAVENOUS | Status: AC
  Filled 2020-03-15: qty 100

## 2020-03-15 MED ORDER — ACETAMINOPHEN 10 MG/ML IV SOLN: 1000.0000 mg | Freq: Once | INTRAVENOUS | Status: DC | PRN

## 2020-03-15 MED ORDER — ONDANSETRON HCL 4 MG/2ML IJ SOLN: 4.0000 mg | Freq: Once | INTRAMUSCULAR | Status: DC | PRN

## 2020-03-15 MED ORDER — SODIUM CHLORIDE 0.9 % IR SOLN
Status: DC | PRN
  Administered 2020-03-15: 3000 mL via INTRAVESICAL

## 2020-03-15 SURGICAL SUPPLY — 28 items
BAG DRAIN URO-CYSTO SKYTR STRL (DRAIN) ×3 IMPLANT
BAG DRN UROCATH (DRAIN) ×1
BASKET STONE 1.7 NGAGE (UROLOGICAL SUPPLIES) IMPLANT
CATH INTERMIT  6FR 70CM (CATHETERS) IMPLANT
CLOTH BEACON ORANGE TIMEOUT ST (SAFETY) ×3 IMPLANT
EVACUATOR MICROVAS BLADDER (UROLOGICAL SUPPLIES) IMPLANT
EXTRACTOR STONE 1.7FRX115CM (UROLOGICAL SUPPLIES) ×2 IMPLANT
FIBER LASER FLEXIVA 1000 (UROLOGICAL SUPPLIES) IMPLANT
FIBER LASER FLEXIVA 365 (UROLOGICAL SUPPLIES) IMPLANT
FIBER LASER FLEXIVA 550 (UROLOGICAL SUPPLIES) IMPLANT
FIBER LASER TRAC TIP (UROLOGICAL SUPPLIES) ×2 IMPLANT
GLOVE BIO SURGEON STRL SZ8 (GLOVE) ×3 IMPLANT
GOWN STRL REUS W/TWL XL LVL3 (GOWN DISPOSABLE) ×3 IMPLANT
GUIDEWIRE STR DUAL SENSOR (WIRE) ×2 IMPLANT
GUIDEWIRE ZIPWRE .038 STRAIGHT (WIRE) ×3 IMPLANT
IV NS 1000ML (IV SOLUTION) ×3
IV NS 1000ML BAXH (IV SOLUTION) ×1 IMPLANT
IV NS IRRIG 3000ML ARTHROMATIC (IV SOLUTION) ×3 IMPLANT
KIT TURNOVER CYSTO (KITS) ×3 IMPLANT
MANIFOLD NEPTUNE II (INSTRUMENTS) ×3 IMPLANT
NS IRRIG 500ML POUR BTL (IV SOLUTION) ×3 IMPLANT
PACK CYSTO (CUSTOM PROCEDURE TRAY) ×3 IMPLANT
SHEATH URETERAL 12FRX35CM (MISCELLANEOUS) ×2 IMPLANT
STENT URET 6FRX26 CONTOUR (STENTS) ×2 IMPLANT
SYR 10ML LL (SYRINGE) ×3 IMPLANT
TUBE CONNECTING 12'X1/4 (SUCTIONS) ×1
TUBE CONNECTING 12X1/4 (SUCTIONS) ×1 IMPLANT
TUBING UROLOGY SET (TUBING) ×3 IMPLANT

## 2020-03-15 NOTE — Anesthesia Postprocedure Evaluation (Signed)
Anesthesia Post Note  Patient: Ricardo Chen  Procedure(s) Performed: CYSTOSCOPY WITH RETROGRADE PYELOGRAM, URETEROSCOPY AND STENT PLACEMENT (Left Pelvis) HOLMIUM LASER APPLICATION (Left Pelvis)     Patient location during evaluation: PACU Anesthesia Type: General Level of consciousness: awake and alert Pain management: pain level controlled Vital Signs Assessment: post-procedure vital signs reviewed and stable Respiratory status: spontaneous breathing, nonlabored ventilation, respiratory function stable and patient connected to nasal cannula oxygen Cardiovascular status: blood pressure returned to baseline and stable Postop Assessment: no apparent nausea or vomiting Anesthetic complications: no   No complications documented.  Last Vitals:  Vitals:   03/15/20 1008 03/15/20 1124  BP:  137/67  Pulse: 62 (!) 57  Resp: 10 20  Temp:  36.7 C  SpO2: 99% 93%    Last Pain:  Vitals:   03/15/20 1124  TempSrc: Oral  PainSc: 0-No pain                 Barnet Glasgow

## 2020-03-15 NOTE — Discharge Instructions (Signed)
Post Anesthesia Home Care Instructions  Activity: Get plenty of rest for the remainder of the day. A responsible adult should stay with you for 24 hours following the procedure.  For the next 24 hours, DO NOT: -Drive a car -Paediatric nurse -Drink alcoholic beverages -Take any medication unless instructed by your physician -Make any legal decisions or sign important papers.  Meals: Start with liquid foods such as gelatin or soup. Progress to regular foods as tolerated. Avoid greasy, spicy, heavy foods. If nausea and/or vomiting occur, drink only clear liquids until the nausea and/or vomiting subsides. Call your physician if vomiting continues.  Special Instructions/Symptoms: Your throat may feel dry or sore from the anesthesia or the breathing tube placed in your throat during surgery. If this causes discomfort, gargle with warm salt water. The discomfort should disappear within 24 hours.  If you had a scopolamine patch placed behind your ear for the management of post- operative nausea and/or vomiting:  1. The medication in the patch is effective for 72 hours, after which it should be removed.  Wrap patch in a tissue and discard in the trash. Wash hands thoroughly with soap and water. 2. You may remove the patch earlier than 72 hours if you experience unpleasant side effects which may include dry mouth, dizziness or visual disturbances. 3. Avoid touching the patch. Wash your hands with soap and water after contact with the patch.   Ureteral Stent Implantation, Care After This sheet gives you information about how to care for yourself after your procedure. Your health care provider may also give you more specific instructions. If you have problems or questions, contact your health care provider. What can I expect after the procedure? After the procedure, it is common to have:  Nausea.  Mild pain when you urinate. You may feel this pain in your lower back or lower abdomen. The pain  should stop within a few minutes after you urinate. This may last for up to 1 week.  A small amount of blood in your urine for several days. Follow these instructions at home: Medicines  Take over-the-counter and prescription medicines only as told by your health care provider.  If you were prescribed an antibiotic medicine, take it as told by your health care provider. Do not stop taking the antibiotic even if you start to feel better.  Do not drive for 24 hours if you were given a sedative during your procedure.  Ask your health care provider if the medicine prescribed to you requires you to avoid driving or using heavy machinery. Activity  Rest as told by your health care provider.  Avoid sitting for a long time without moving. Get up to take short walks every 1-2 hours. This is important to improve blood flow and breathing. Ask for help if you feel weak or unsteady.  Return to your normal activities as told by your health care provider. Ask your health care provider what activities are safe for you. General instructions   Watch for any blood in your urine. Call your health care provider if the amount of blood in your urine increases.  If you have a catheter: ? Follow instructions from your health care provider about taking care of your catheter and collection bag. ? Do not take baths, swim, or use a hot tub until your health care provider approves. Ask your health care provider if you may take showers. You may only be allowed to take sponge baths.  Drink enough fluid to keep your  urine pale yellow.  Do not use any products that contain nicotine or tobacco, such as cigarettes, e-cigarettes, and chewing tobacco. These can delay healing after surgery. If you need help quitting, ask your health care provider.  Keep all follow-up visits as told by your health care provider. This is important. Contact a health care provider if:  You have pain that gets worse or does not get better  with medicine, especially pain when you urinate.  You have difficulty urinating.  You feel nauseous or you vomit repeatedly during a period of more than 2 days after the procedure. Get help right away if:  Your urine is dark red or has blood clots in it.  You are leaking urine (have incontinence).  The end of the stent comes out of your urethra.  You cannot urinate.  You have sudden, sharp, or severe pain in your abdomen or lower back.  You have a fever.  You have swelling or pain in your legs.  You have difficulty breathing. Summary  After the procedure, it is common to have mild pain when you urinate that goes away within a few minutes after you urinate. This may last for up to 1 week.  Watch for any blood in your urine. Call your health care provider if the amount of blood in your urine increases.  Take over-the-counter and prescription medicines only as told by your health care provider.  Drink enough fluid to keep your urine pale yellow. This information is not intended to replace advice given to you by your health care provider. Make sure you discuss any questions you have with your health care provider. Document Revised: 06/07/2018 Document Reviewed: 06/08/2018 Elsevier Patient Education  2020 [employer].

## 2020-03-15 NOTE — Op Note (Addendum)
.  Preoperative diagnosis: Left renal stone  Postoperative diagnosis: Same  Procedure: 1 cystoscopy 2. Left retrograde pyelography 3.  Intraoperative fluoroscopy, under one hour, with interpretation 4.  Left ureteroscopic stone manipulation with laser lithotripsy 5.  Left 6 x 26 JJ stent exchange  Attending: Rosie Fate  Anesthesia: General  Estimated blood loss: None  Drains: Left 6 x 26 JJ ureteral stent without tether  Specimens: stone for analysis  Antibiotics: ancef  Findings: leftmid pole stone. No hydronephrosis. No masses/lesions in the bladder. Ureteral orifices in normal anatomic location.  Indications: Patient is a 69 year old male with a history of left UPJ calculus who underwent stent placement 2 months ago. After discussing treatment options, he decided proceed with left ureteroscopic stone manipulation.  Procedure her in detail: The patient was brought to the operating room and a brief timeout was done to ensure correct patient, correct procedure, correct site.  General anesthesia was administered patient was placed in dorsal lithotomy position.  Her genitalia was then prepped and draped in usual sterile fashion.  A rigid 21 French cystoscope was passed in the urethra and the bladder.  Bladder was inspected free masses or lesions.  the ureteral orifices were in the normal orthotopic locations. Using a grasper the left ureterals tent was brought to the urethral meatus. A zipwire was advanced through the stent and up to the renal pelvis. We then removed the stent. a 6 french ureteral catheter was then instilled into the left ureteral orifice.  a gentle retrograde was obtained and findings noted above.  we then placed a zip wire through the ureteral catheter and advanced up to the renal pelvis.  we then removed the cystoscope and cannulated the left ureteral orifice with a semirigid ureteroscope.  No stone was found in the ureter. Once we reached the UPJ a sensor wire was  advanced in to the renal pelvis. We then removed the ureteroscope and advanced am 12/14 x 35cm access sheath up to the renal pelvis. We then used the flexible ureteroscope to perform nephroscopy. We encountered the stone in the mid pole.  Using a 200nm laser fiber the stone was fragmented and  the fragments were then removed with a Ngage basket.    Once all stone fragments were removed we then removed the access sheath under direct vision and noted no injury to the ureter. We then placed a 6 x 26 double-j ureteral stent over the original zip wire.  We then removed the wire and good coil was noted in the the renal pelvis under fluoroscopy and the bladder under direct vision. the bladder was then drained and this concluded the procedure which was well tolerated by patient.  Complications: None  Condition: Stable, extubated, transferred to PACU  Plan: Patient is to be discharged home as to follow-up in one week for stent removal. The patient will be scheduled for right PCNL for his asymptomatic right UPJ calculus

## 2020-03-15 NOTE — Transfer of Care (Signed)
Immediate Anesthesia Transfer of Care Note  Patient: JAIMON BUGAJ  Procedure(s) Performed: Procedure(s) (LRB): CYSTOSCOPY WITH RETROGRADE PYELOGRAM, URETEROSCOPY AND STENT PLACEMENT (Left) HOLMIUM LASER APPLICATION (Left)  Patient Location: PACU  Anesthesia Type: General  Level of Consciousness: awake, sedated, patient cooperative and responds to stimulation  Airway & Oxygen Therapy: Patient Spontanous Breathing and Patient connected to [place] 02 and soft FM   Post-op Assessment: Report given to PACU RN, Post -op Vital signs reviewed and stable and Patient moving all extremities  Post vital signs: Reviewed and stable  Complications: No apparent anesthesia complications

## 2020-03-15 NOTE — Anesthesia Procedure Notes (Addendum)
Procedure Name: LMA Insertion Date/Time: 03/15/2020 7:52 AM Performed by: Justice Rocher, CRNA Pre-anesthesia Checklist: Patient identified, Emergency Drugs available, Suction available, Patient being monitored and Timeout performed Patient Re-evaluated:Patient Re-evaluated prior to induction Oxygen Delivery Method: Circle system utilized Preoxygenation: Pre-oxygenation with 100% oxygen Induction Type: IV induction Ventilation: Mask ventilation without difficulty LMA: LMA inserted LMA Size: 4.0 Number of attempts: 1 Airway Equipment and Method: Bite block Placement Confirmation: positive ETCO2,  breath sounds checked- equal and bilateral and CO2 detector Tube secured with: Tape Dental Injury: Teeth and Oropharynx as per pre-operative assessment  Comments: Poor dental habits Teeth broken, decayed, missing, chipped - preop

## 2020-03-15 NOTE — H&P (Signed)
Urology Admission H&P  Chief Complaint: left flank pain  History of Present Illness: [name] is a 69yo with a remote history of nephrolithiasis who presented to the ER this morning with a 1 week hx of severe left flank pain. The pain is sharp, intermittent, severe and nonradiaiting. He has associated nausea but no vomiting. Ct obtained in the ER shows a 1.7cm right UPJ calculus and a left 1cm calculus. Creatinine 1.7. last stone event 20 years ago. No other associated symptoms. No exacerbating/alleviaitng events.  He underwent stent placement on 01/18/2020      Past Medical History:  Diagnosis Date  . Anxiety   . Collar bone fracture 1995  . Kidney stones         Past Surgical History:  Procedure Laterality Date  . TONSILLECTOMY      Home Medications:           Current Facility-Administered Medications  Medication Dose Route Frequency Provider Last Rate Last Admin  . opium-belladonna (B&O) suppository 16.2-60mg   1 suppository Rectal To OR Clayvon Parlett, Candee Furbish, MD      . Doug Sou Hold] sodium chloride flush (NS) 0.9 % injection 3 mL  3 mL Intravenous Once Trifan, Carola Rhine, MD       Allergies: No Known Allergies       Family History  Problem Relation Age of Onset  . Heart failure Mother   . Colon cancer Father   . Heart disease Father    Social History:  reports that he quit smoking about 40 years ago. He has a 1.00 pack-year smoking history. He has never used smokeless tobacco. He reports current alcohol use of about 2.0 standard drinks of alcohol per week. He reports that he does not use drugs.  Review of Systems  Constitutional: Negative for fever.  Genitourinary: Positive for flank pain.  All other systems reviewed and are negative.   Physical Exam:  Vital signs in last 24 hours: Temp:  [98.7 F (37.1 C)] 98.7 F (37.1 C) (05/06 0842) Pulse Rate:  [61-85] 85 (05/06 1300) Resp:  [18] 18 (05/06 0842) BP: (110-149)/(67-91) 126/76 (05/06 1300) SpO2:   [95 %-100 %] 97 % (05/06 1300) Weight:  [59 kg] 59 kg (05/06 0842) Physical Exam  Constitutional: He is oriented to person, place, and time. He appears well-developed and well-nourished.  HENT:  Head: Normocephalic and atraumatic.  Eyes: Pupils are equal, round, and reactive to light. EOM are normal.  Neck: No thyromegaly present.  Cardiovascular: Normal rate and regular rhythm.  Respiratory: Effort normal. No respiratory distress.  GI: Soft. He exhibits no distension.  Musculoskeletal:        General: No edema. Normal range of motion.     Cervical back: Normal range of motion.  Neurological: He is alert and oriented to person, place, and time.  Skin: Skin is warm and dry.  Psychiatric: He has a normal mood and affect. His behavior is normal. Judgment and thought content normal.    Laboratory Data:  Lab Results Last 24 Hours       Results for orders placed or performed during the hospital encounter of 01/18/20 (from the past 24 hour(s))  Lipase, blood     Status: Abnormal   Collection Time: 01/18/20  8:52 AM  Result Value Ref Range   Lipase 59 (H) 11 - 51 U/L  Comprehensive metabolic panel     Status: Abnormal   Collection Time: 01/18/20  8:52 AM  Result Value Ref Range   Sodium  139 135 - 145 mmol/L   Potassium 3.6 3.5 - 5.1 mmol/L   Chloride 99 98 - 111 mmol/L   CO2 30 22 - 32 mmol/L   Glucose, Bld 165 (H) 70 - 99 mg/dL   BUN 12 8 - 23 mg/dL   Creatinine, Ser 1.70 (H) 0.61 - 1.24 mg/dL   Calcium 10.0 8.9 - 10.3 mg/dL   Total Protein 7.7 6.5 - 8.1 g/dL   Albumin 4.5 3.5 - 5.0 g/dL   AST 30 15 - 41 U/L   ALT 20 0 - 44 U/L   Alkaline Phosphatase 68 38 - 126 U/L   Total Bilirubin 0.6 0.3 - 1.2 mg/dL   GFR calc non Af Amer 40 (L) >60 mL/min   GFR calc Af Amer 46 (L) >60 mL/min   Anion gap 10 5 - 15  CBC     Status: Abnormal   Collection Time: 01/18/20  8:52 AM  Result Value Ref Range   WBC 13.7 (H) 4.0 - 10.5 K/uL   RBC 5.28 4.22 - 5.81 MIL/uL    Hemoglobin 14.8 13.0 - 17.0 g/dL   HCT 46.8 39.0 - 52.0 %   MCV 88.6 80.0 - 100.0 fL   MCH 28.0 26.0 - 34.0 pg   MCHC 31.6 30.0 - 36.0 g/dL   RDW 15.7 (H) 11.5 - 15.5 %   Platelets 239 150 - 400 K/uL   nRBC 0.0 0.0 - 0.2 %  Urinalysis, Routine w reflex microscopic     Status: Abnormal   Collection Time: 01/18/20 12:23 PM  Result Value Ref Range   Color, Urine YELLOW YELLOW   APPearance HAZY (A) CLEAR   Specific Gravity, Urine 1.016 1.005 - 1.030   pH 7.0 5.0 - 8.0   Glucose, UA NEGATIVE NEGATIVE mg/dL   Hgb urine dipstick LARGE (A) NEGATIVE   Bilirubin Urine NEGATIVE NEGATIVE   Ketones, ur NEGATIVE NEGATIVE mg/dL   Protein, ur 30 (A) NEGATIVE mg/dL   Nitrite NEGATIVE NEGATIVE   Leukocytes,Ua SMALL (A) NEGATIVE   RBC / HPF >50 (H) 0 - 5 RBC/hpf   WBC, UA 21-50 0 - 5 WBC/hpf   Bacteria, UA RARE (A) NONE SEEN   Squamous Epithelial / LPF 0-5 0 - 5   Mucus PRESENT   Respiratory Panel by RT PCR (Flu A&B, Covid) - Nasopharyngeal Swab     Status: None   Collection Time: 01/18/20  1:09 PM   Specimen: Nasopharyngeal Swab  Result Value Ref Range   SARS Coronavirus 2 by RT PCR NEGATIVE NEGATIVE   Influenza A by PCR NEGATIVE NEGATIVE   Influenza B by PCR NEGATIVE NEGATIVE            Recent Results (from the past 240 hour(s))  Respiratory Panel by RT PCR (Flu A&B, Covid) - Nasopharyngeal Swab     Status: None   Collection Time: 01/18/20  1:09 PM   Specimen: Nasopharyngeal Swab  Result Value Ref Range Status   SARS Coronavirus 2 by RT PCR NEGATIVE NEGATIVE Final    Comment: (NOTE) SARS-CoV-2 target nucleic acids are NOT DETECTED. The SARS-CoV-2 RNA is generally detectable in upper respiratoy specimens during the acute phase of infection. The lowest concentration of SARS-CoV-2 viral copies this assay can detect is 131 copies/mL. A negative result does not preclude SARS-Cov-2 infection and should not be used as the sole basis for treatment  or other patient management decisions. A negative result may occur with  improper specimen collection/handling, submission of specimen other  than nasopharyngeal swab, presence of viral mutation(s) within the areas targeted by this assay, and inadequate number of viral copies (<131 copies/mL). A negative result must be combined with clinical observations, patient history, and epidemiological information. The expected result is Negative. Fact Sheet for Patients:  PinkCheek.be Fact Sheet for Healthcare Providers:  GravelBags.it This test is not yet ap proved or cleared by the [place] FDA and  has been authorized for detection and/or diagnosis of SARS-CoV-2 by FDA under an Emergency Use Authorization (EUA). This EUA will remain  in effect (meaning this test can be used) for the duration of the COVID-19 declaration under Section 564(b)(1) of the Act, 21 U.S.C. section 360bbb-3(b)(1), unless the authorization is terminated or revoked sooner.    Influenza A by PCR NEGATIVE NEGATIVE Final   Influenza B by PCR NEGATIVE NEGATIVE Final    Comment: (NOTE) The Xpert Xpress SARS-CoV-2/FLU/RSV assay is intended as an aid in  the diagnosis of influenza from Nasopharyngeal swab specimens and  should not be used as a sole basis for treatment. Nasal washings and  aspirates are unacceptable for Xpert Xpress SARS-CoV-2/FLU/RSV  testing. Fact Sheet for Patients: PinkCheek.be Fact Sheet for Healthcare Providers: GravelBags.it This test is not yet approved or cleared by the [place] FDA and  has been authorized for detection and/or diagnosis of SARS-CoV-2 by  FDA under an Emergency Use Authorization (EUA). This EUA will remain  in effect (meaning this test can be used) for the duration of the  Covid-19 declaration under Section 564(b)(1) of the Act, 21  U.S.C. section  360bbb-3(b)(1), unless the authorization is  terminated or revoked. Performed at [facility], [place] [address] [place],  [address]    Creatinine:    Recent Labs    01/18/20 3846  CREATININE 1.70*   Baseline Creatinine: 1  Impression/Assessment:  69yo with bilateral ureteral calculi  Plan:  The risks/benefits/alterantives to left ureteroscopic stone extraction was explained to the patient and he understands and wishes to proceed with surgery

## 2020-03-19 ENCOUNTER — Encounter (HOSPITAL_BASED_OUTPATIENT_CLINIC_OR_DEPARTMENT_OTHER): Payer: Self-pay | Admitting: Urology

## 2020-04-01 ENCOUNTER — Other Ambulatory Visit: Payer: Self-pay | Admitting: Urology

## 2020-04-02 ENCOUNTER — Other Ambulatory Visit (HOSPITAL_COMMUNITY): Payer: Self-pay | Admitting: Urology

## 2020-04-02 DIAGNOSIS — N135 Crossing vessel and stricture of ureter without hydronephrosis: Secondary | ICD-10-CM

## 2020-05-09 NOTE — Patient Instructions (Signed)
DUE TO COVID-19 ONLY ONE VISITOR IS ALLOWED TO COME WITH YOU AND STAY IN THE WAITING ROOM ONLY DURING PRE OP AND PROCEDURE DAY OF SURGERY. THE 1 VISITOR  MAY VISIT WITH YOU AFTER SURGERY IN YOUR PRIVATE ROOM DURING VISITING HOURS ONLY!  YOU NEED TO HAVE A COVID 19 TEST ON 05-21-20 @_______ , THIS TEST MUST BE DONE BEFORE SURGERY,  COVID TESTING SITE [facility] [place] [id number], IT IS ON THE RIGHT GOING OUT [address] APPROXIMATELY  2 MINUTES PAST [employer] ON THE RIGHT. ONCE YOUR COVID TEST IS COMPLETED,  PLEASE BEGIN THE QUARANTINE INSTRUCTIONS AS OUTLINED IN YOUR HANDOUT.                Ricardo Chen  05/09/2020   Your procedure is scheduled on: 05-24-20   Report to [facility] Main  Entrance    Report to Admitting at 8:55 AM     Call this number if you have problems the morning of surgery [phone]    Remember: After Midnight. You may have clear liquids from midnight until 7:55 AM.     CLEAR LIQUID DIET   Foods Allowed                                                                       Coffee and tea, regular and decaf                              Plain Jell-O any favor except red or purple                                            Fruit ices (not with fruit pulp)                                      Iced Popsicles                                     Carbonated beverages, regular and diet                                    Cranberry, grape and apple juices Sports drinks like Gatorade Lightly seasoned clear broth or consume(fat free) Sugar, honey syrup   _____________________________________________________________________         Take these medicines the morning of surgery with A SIP OF WATER: None  BRUSH YOUR TEETH MORNING OF SURGERY AND RINSE YOUR MOUTH OUT, NO CHEWING GUM CANDY OR MINTS.                               You may not have any metal on your body including hair pins and              piercings  Do not  wear jewelry, make-up, lotions, powders or perfumes, deodorant              Men may shave face and neck.   Do not bring valuables to the hospital. [place].  Contacts, dentures or bridgework may not be worn into surgery.  You may bring a small overnight bag     Patients discharged the day of surgery will not be allowed to drive home. IF YOU ARE HAVING SURGERY AND GOING HOME THE SAME DAY, YOU MUST HAVE AN ADULT TO DRIVE YOU HOME AND BE WITH YOU FOR 24 HOURS. YOU MAY GO HOME BY TAXI OR UBER OR ORTHERWISE, BUT AN ADULT MUST ACCOMPANY YOU HOME AND STAY WITH YOU FOR 24 HOURS.                Please read over the following fact sheets you were given: _____________________________________________________________________  [facility] - Preparing for Surgery Before surgery, you can play an important role.  Because skin is not sterile, your skin needs to be as free of germs as possible.  You can reduce the number of germs on your skin by washing with CHG (chlorahexidine gluconate) soap before surgery.  CHG is an antiseptic cleaner which kills germs and bonds with the skin to continue killing germs even after washing. Please DO NOT use if you have an allergy to CHG or antibacterial soaps.  If your skin becomes reddened/irritated stop using the CHG and inform your nurse when you arrive at Short Stay. Do not shave (including legs and underarms) for at least 48 hours prior to the first CHG shower.  You may shave your face/neck. Please follow these instructions carefully:  1.  Shower with CHG Soap the night before surgery and the  morning of Surgery.  2.  If you choose to wash your hair, wash your hair first as usual with your  normal  shampoo.  3.  After you shampoo, rinse your hair and body thoroughly to remove the  shampoo.                           4.  Use CHG as you would any other liquid soap.  You can apply chg directly  to the skin and wash                        Gently with a scrungie or clean washcloth.  5.  Apply the CHG Soap to your body ONLY FROM THE NECK DOWN.   Do not use on face/ open                           Wound or open sores. Avoid contact with eyes, ears mouth and genitals (private parts).                       Wash face,  Genitals (private parts) with your normal soap.             6.  Wash thoroughly, paying special attention to the area where your surgery  will be performed.  7.  Thoroughly rinse your body with warm water from the neck down.  8.  DO NOT shower/wash with your normal soap after using and rinsing off  the CHG  Soap.                9.  Pat yourself dry with a clean towel.            10.  Wear clean pajamas.            11.  Place clean sheets on your bed the night of your first shower and do not  sleep with pets. Day of Surgery : Do not apply any lotions/deodorants the morning of surgery.  Please wear clean clothes to the hospital/surgery center.  FAILURE TO FOLLOW THESE INSTRUCTIONS MAY RESULT IN THE CANCELLATION OF YOUR SURGERY PATIENT SIGNATURE_________________________________  NURSE SIGNATURE__________________________________  ________________________________________________________________________

## 2020-05-09 NOTE — Progress Notes (Signed)
COVID Vaccine Completed: Date COVID Vaccine completed: COVID vaccine manufacturer: [employer]   PCP - Mauricio Po, FNP Cardiologist -   Chest x-ray -  EKG -  Stress Test -  ECHO -  Cardiac Cath -   Sleep Study -  CPAP -   Fasting Blood Sugar -  Checks Blood Sugar _____ times a day  Blood Thinner Instructions: Aspirin Instructions: Last Dose:  Anesthesia review:   Patient denies shortness of breath, fever, cough and chest pain at PAT appointment   Patient verbalized understanding of instructions that were given to them at the PAT appointment. Patient was also instructed that they will need to review over the PAT instructions again at home before surgery.

## 2020-05-13 ENCOUNTER — Encounter (HOSPITAL_COMMUNITY)
Admission: RE | Admit: 2020-05-13 | Discharge: 2020-05-13 | Disposition: A | Discharge status: Home / Self Care | Payer: Medicare Other | Source: Ambulatory Visit | Attending: Urology | Admitting: Urology

## 2020-05-24 ENCOUNTER — Ambulatory Visit (HOSPITAL_COMMUNITY): Payer: Medicare Other

## 2020-05-24 ENCOUNTER — Other Ambulatory Visit (HOSPITAL_COMMUNITY): Payer: Medicare Other

## 2020-05-24 ENCOUNTER — Ambulatory Visit: Admit: 2020-05-24 | Payer: Medicare Other | Admitting: Urology

## 2020-05-24 SURGERY — NEPHROLITHOTOMY PERCUTANEOUS
Anesthesia: Choice | Laterality: Right

## 2020-08-09 ENCOUNTER — Other Ambulatory Visit: Payer: Self-pay | Admitting: Urology

## 2021-01-15 ENCOUNTER — Other Ambulatory Visit: Payer: Self-pay | Admitting: Urology

## 2021-02-19 ENCOUNTER — Other Ambulatory Visit: Payer: Self-pay | Admitting: Urology

## 2021-03-23 ENCOUNTER — Other Ambulatory Visit: Payer: Self-pay | Admitting: Urology

## 2021-03-28 IMAGING — RF DG RETROGRADE PYELOGRAM
1 series · 7 of 7 positions shown · non-contrast
Comparison: CT dated January 18, 2020

CLINICAL DATA: Bilateral retrograde pyelogram with left-sided stent
placement and attempted right-sided stent placement.

EXAM:
RETROGRADE PYELOGRAM

[Series 1: run · 7 of 7 slices shown]
[im 1/7]
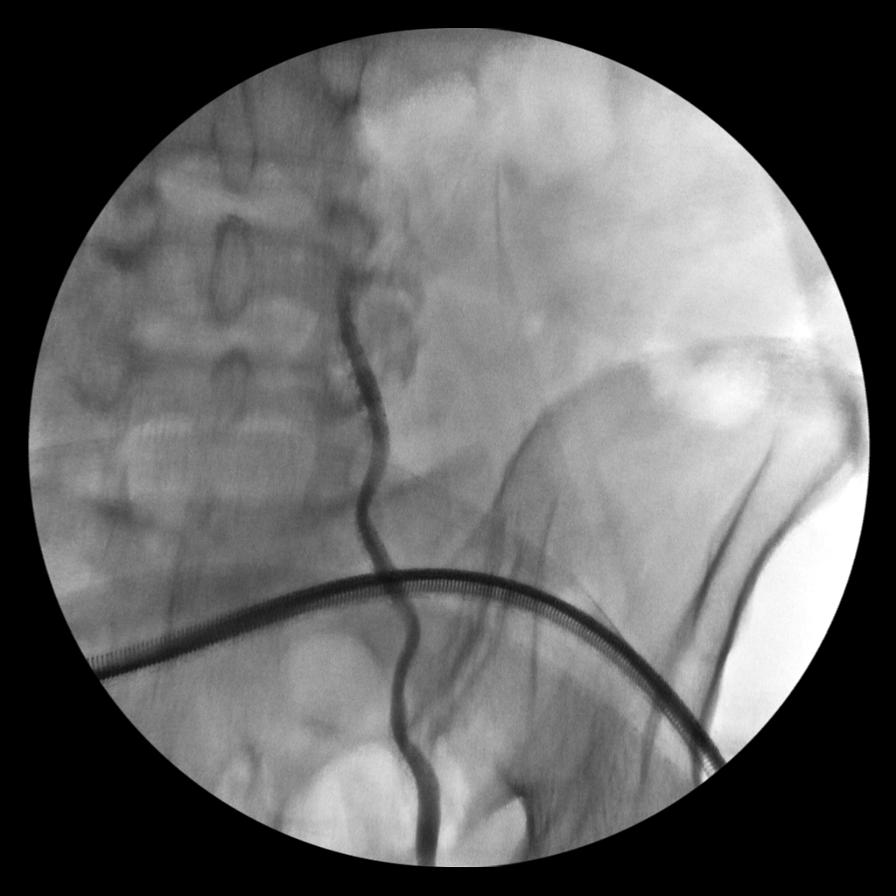
[im 2/7]
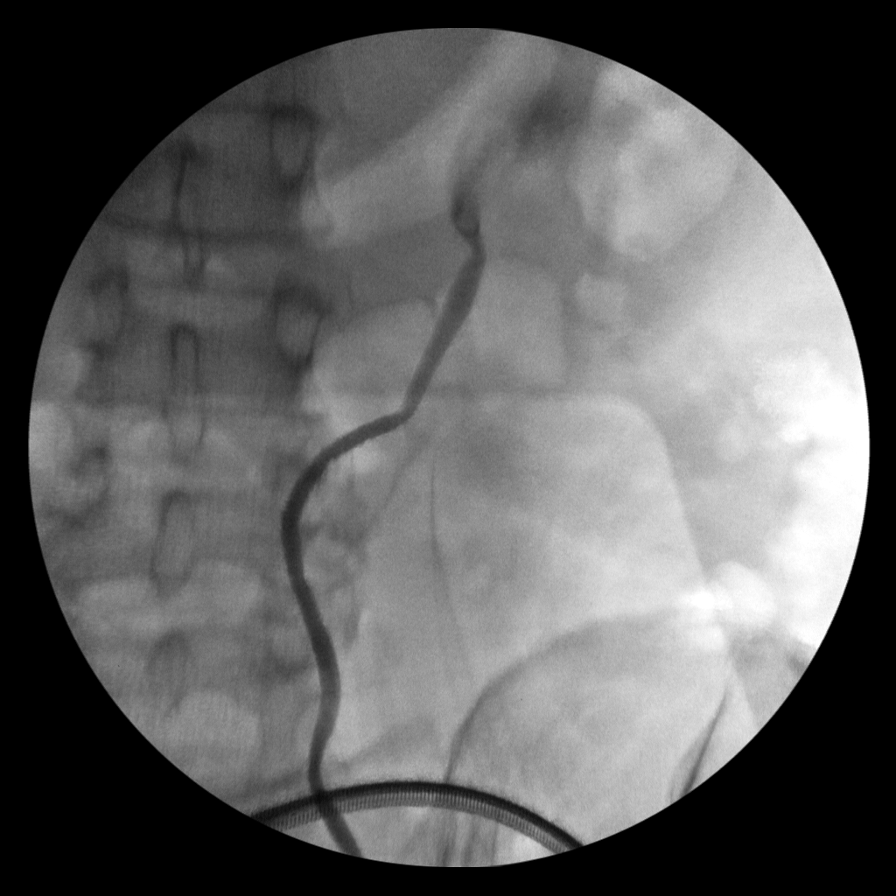
[im 3/7]
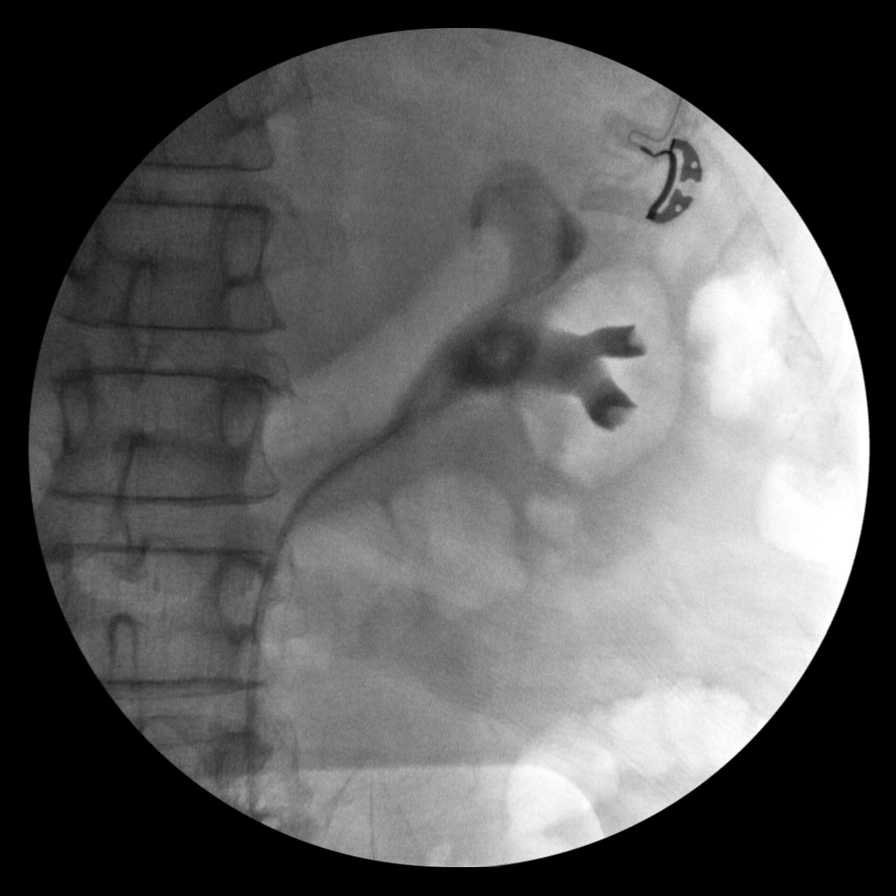
[im 4/7]
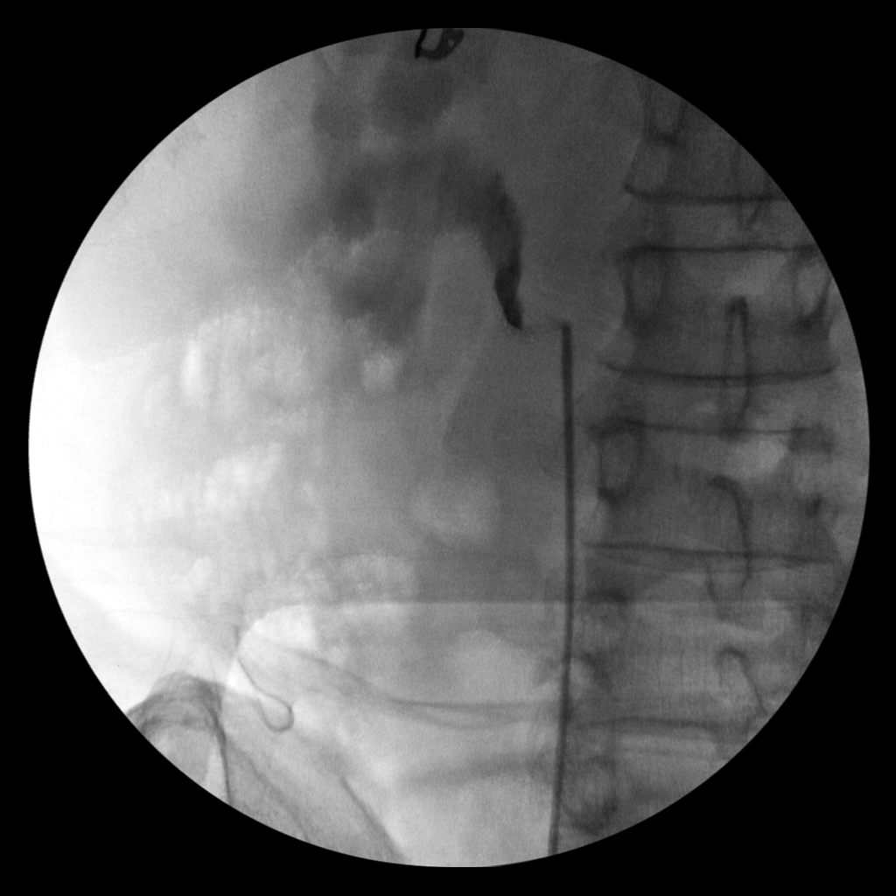
[im 5/7]
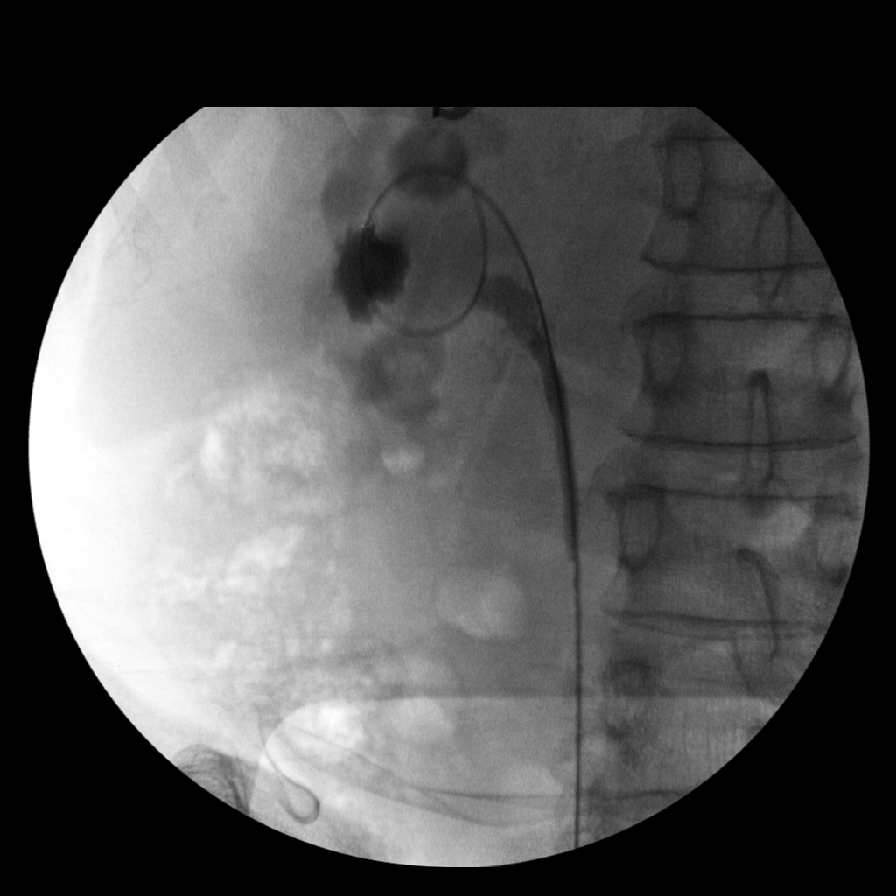
[im 6/7]
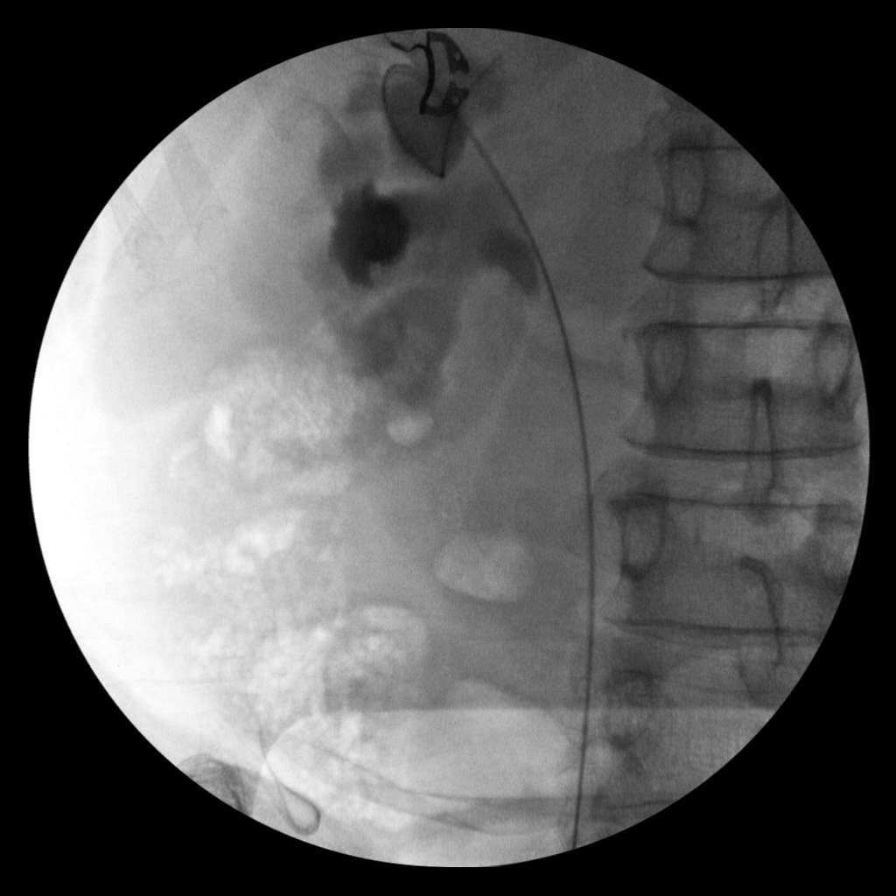
[im 7/7]
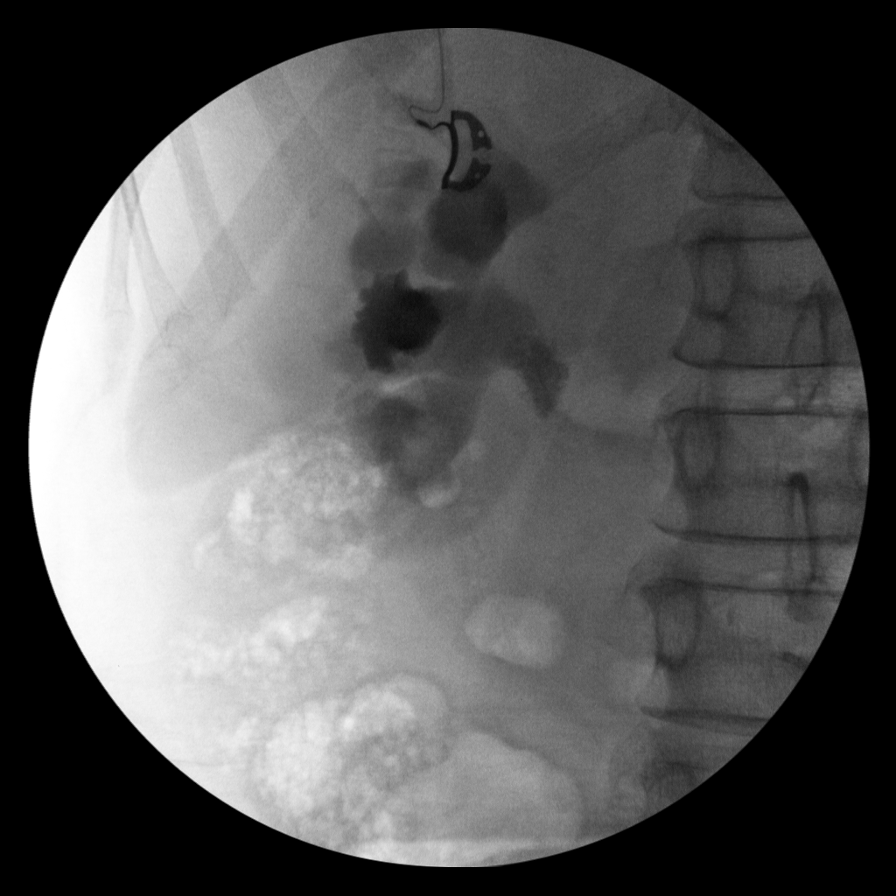

[7 of 7 positions shown; findings below may reference images not displayed]

FINDINGS: On the images submitted, the patient's left and right sides were not
distinguished. As a stent is visualized on the third image this is
favored to be the patient's left side as indicated in the history.
There is injection of contrast which demonstrates a filling defect
within the proximal left ureter. Subsequent images demonstrate a
left-sided ureteral stent terminating in a left upper pole calyx.
Subsequent images demonstrate catheterization of the right ureter
with injection of contrast. This demonstrates right-sided
hydronephrosis. There appears to be a filling defect at the right
UPJ likely representing the stone. No right-sided ureteral stent was
noted. The total fluoroscopy time was 2 minutes and 12 seconds.
Seven images were submitted for evaluation.
IMPRESSION: Status post urologic intervention as detailed above. Please see
separate operative report for complete details.

## 2021-03-28 IMAGING — CT CT ABD-PELV W/ CM
2 of 5 series · 16 of 46 positions shown, 18 images · IV contrast (APPLIED)
Comparison: None available

CLINICAL DATA: Left lower quadrant pain for 2 days with
diverticulitis suspected

EXAM:
CT ABDOMEN AND PELVIS WITH CONTRAST
TECHNIQUE: Multidetector CT imaging of the abdomen and pelvis was performed
using the standard protocol following bolus administration of
intravenous contrast.
CONTRAST:  100mL OMNIPAQUE IOHEXOL 300 MG/ML  SOLN

[Series 3: abdomen 5.0 · axial · 0.69mm/px · z∈[-414,-60]mm · 13 of 83 slices shown, 15 images]
[im 6/83  soft-tissue]
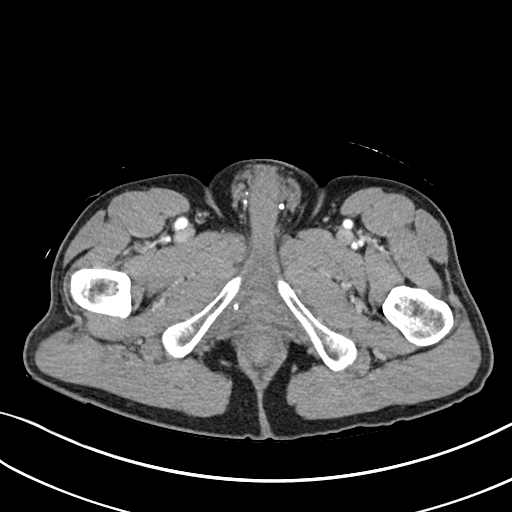
[im 6/83  bone]
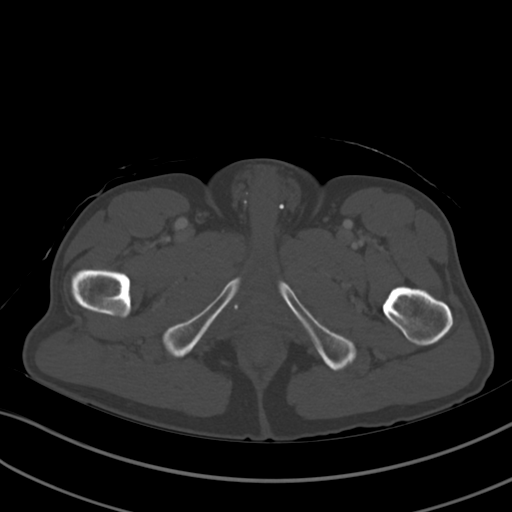
[im 11/83  soft-tissue]
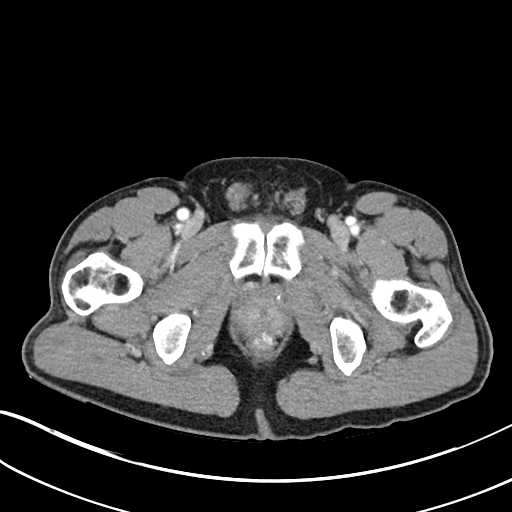
[im 17/83  soft-tissue]
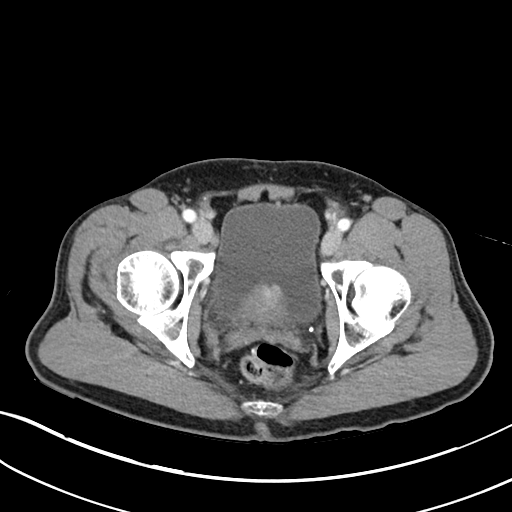
[im 22/83  soft-tissue]
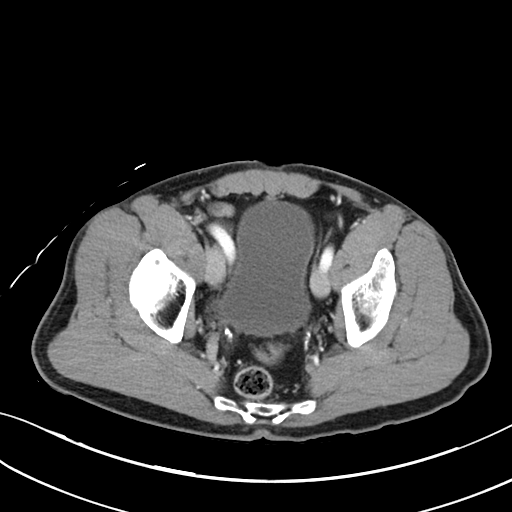
[im 28/83  soft-tissue]
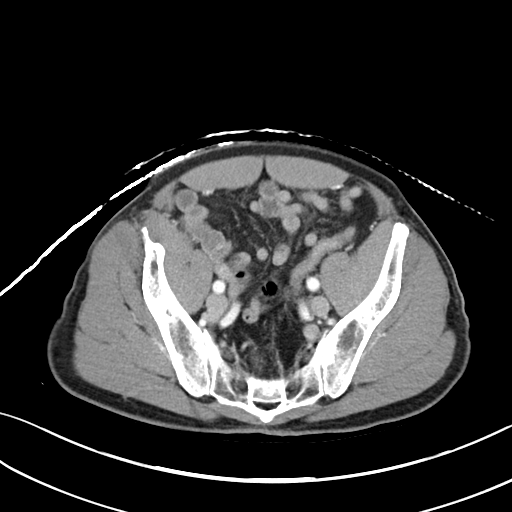
[im 33/83  soft-tissue]
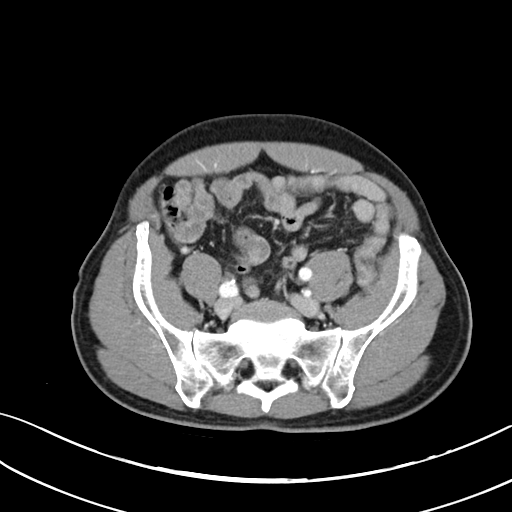
[im 44/83  soft-tissue]
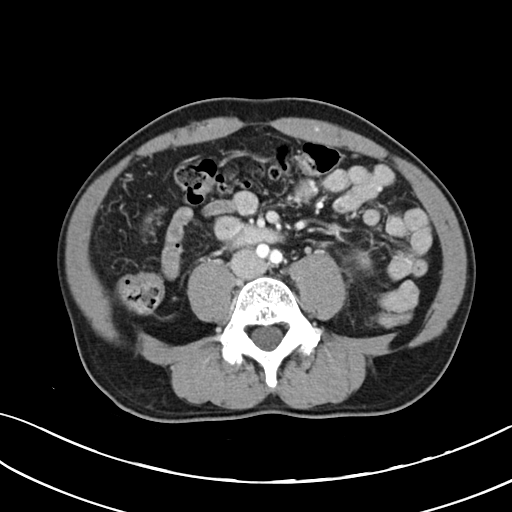
[im 50/83  soft-tissue]
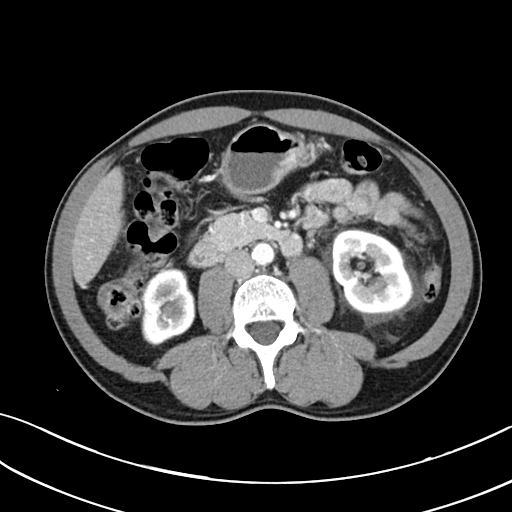
[im 55/83  soft-tissue]
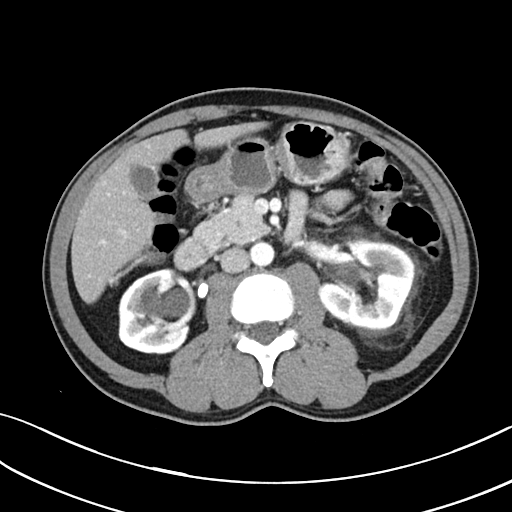
[im 55/83  bone]
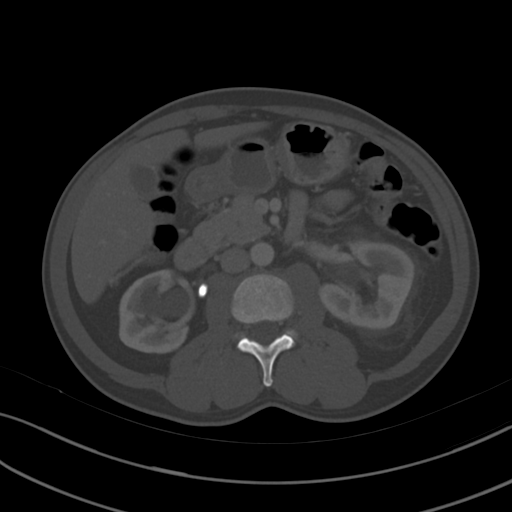
[im 61/83  soft-tissue]
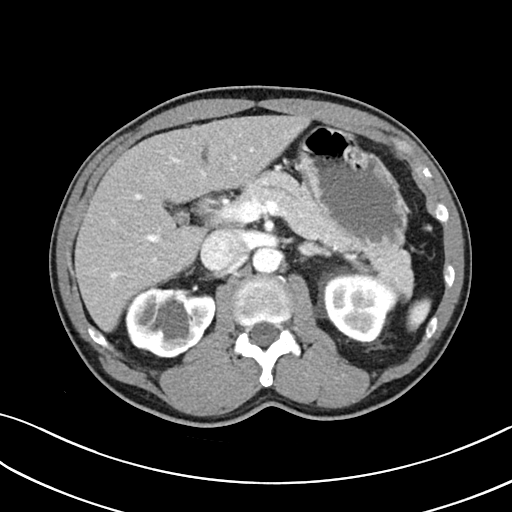
[im 66/83  soft-tissue]
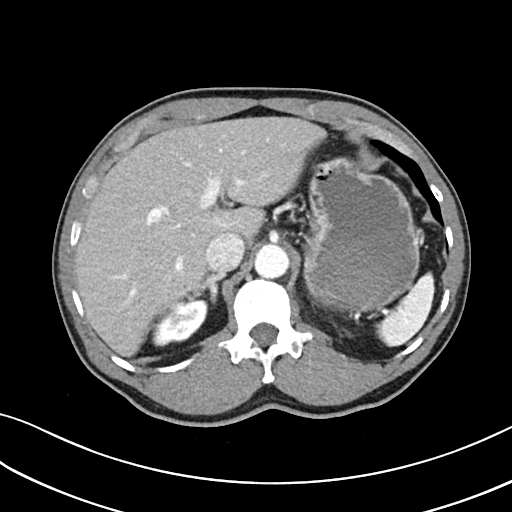
[im 72/83  soft-tissue]
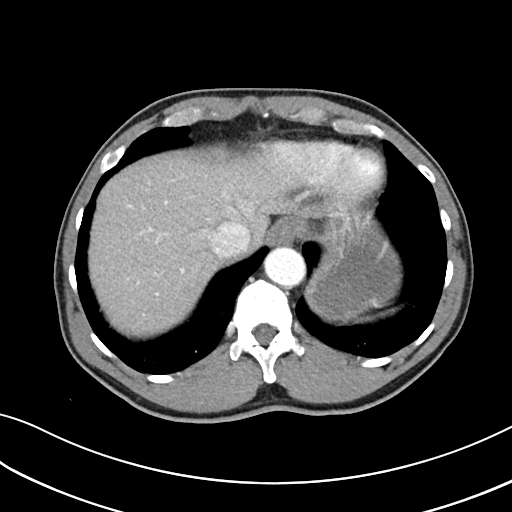
[im 77/83  soft-tissue]
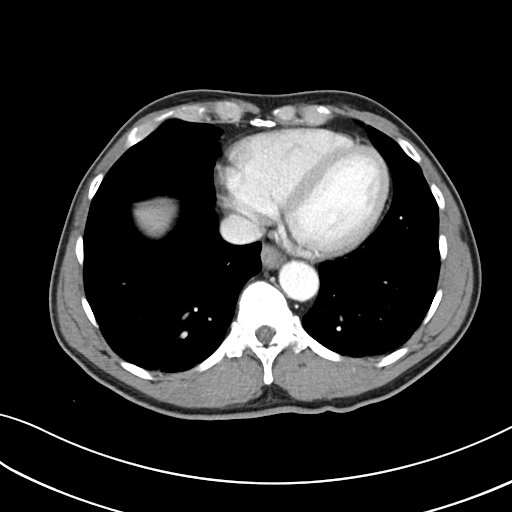

[Series 6: abdomen 3.0 mpr cor · coronal · 0.59mm/px · 3 of 84 slices shown]
[im 28/84  soft-tissue]
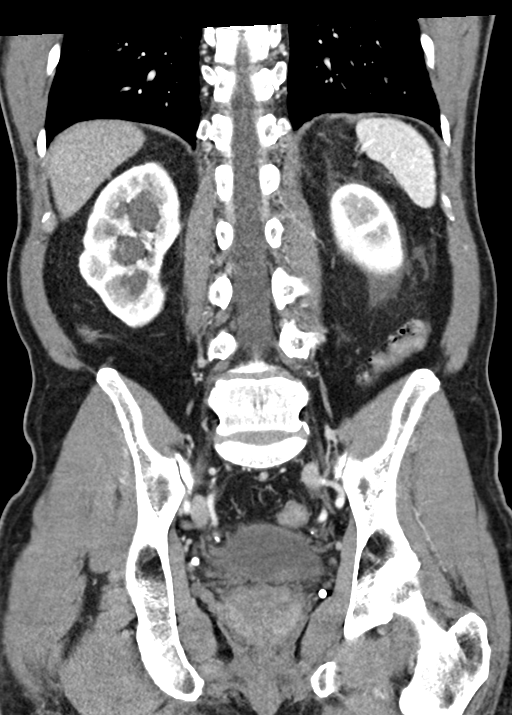
[im 37/84  soft-tissue]
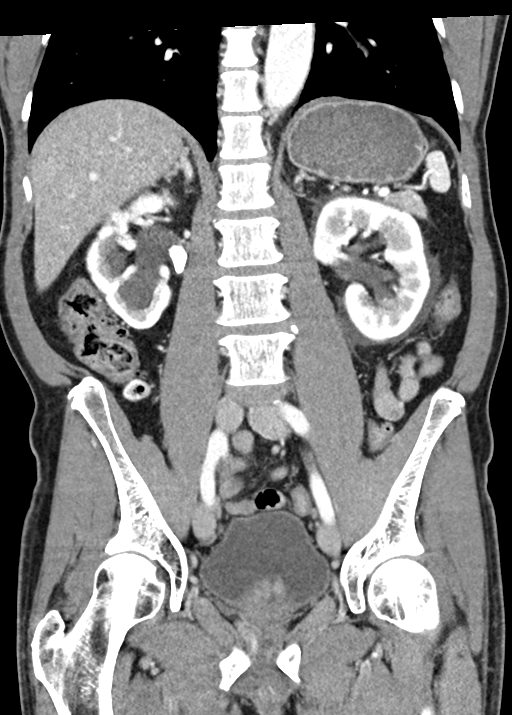
[im 47/84  soft-tissue]
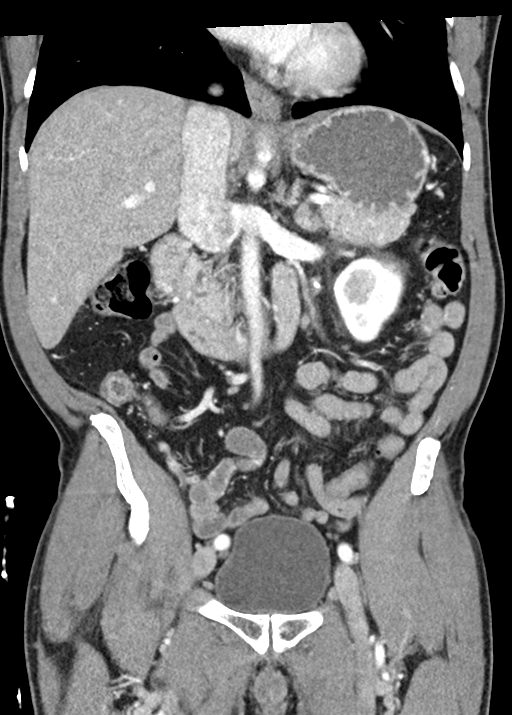

[16 of 46 positions shown; findings below may reference images not displayed]

FINDINGS: Lower chest:  Mild dependent atelectasis

Hepatobiliary: No focal liver abnormality.No evidence of biliary
obstruction or stone.

Pancreas: Unremarkable.

Spleen: Unremarkable.

Adrenals/Urinary Tract: Negative adrenals. Left hydronephrosis and
perinephric stranding due to a 8 mm UPJ calculus. There is also
right hydronephrosis from a 17 x 11 mm UPJ calculus. There is
right-sided cortical thinning and no right-sided hydronephrosis,
suspect this obstructing stone is chronic. 4 mm lower pole calculus
on the right. Unremarkable bladder.

Stomach/Bowel: No obstruction. Colonic diverticulosis. No evidence
of diverticulitis.

Vascular/Lymphatic: No acute vascular abnormality. No mass or
adenopathy.

Reproductive:Enlarged prostate projecting upward into the bladder
base.

Other: No ascites or pneumoperitoneum.

Musculoskeletal: No acute abnormalities. Disc bulging and lower
lumbar facet spurring.
IMPRESSION: 1. Obstructing 8 mm left UPJ calculus, acute appearing.
2. Obstructing 17 x 11 mm right UVJ calculus-likely chronic given
there is cortical thinning and a lack of perinephric edema.
3. 4 mm right renal calculus.

## 2021-04-08 ENCOUNTER — Other Ambulatory Visit: Payer: Self-pay | Admitting: Urology

## 2022-06-24 ENCOUNTER — Other Ambulatory Visit: Payer: Self-pay | Admitting: Urology

## 2023-02-04 ENCOUNTER — Other Ambulatory Visit: Payer: Self-pay | Admitting: Urology

## 2023-05-28 ENCOUNTER — Other Ambulatory Visit: Payer: Self-pay | Admitting: Student

## 2023-05-28 DIAGNOSIS — R0989 Other specified symptoms and signs involving the circulatory and respiratory systems: Secondary | ICD-10-CM

## 2023-06-04 ENCOUNTER — Ambulatory Visit
Admission: RE | Admit: 2023-06-04 | Discharge: 2023-06-04 | Disposition: A | Discharge status: Home / Self Care | Payer: Medicare (Managed Care) | Source: Ambulatory Visit | Attending: Student | Admitting: Student

## 2023-06-04 DIAGNOSIS — R0989 Other specified symptoms and signs involving the circulatory and respiratory systems: Secondary | ICD-10-CM

## 2023-06-18 ENCOUNTER — Ambulatory Visit: Payer: Medicare (Managed Care) | Attending: Cardiology | Admitting: Cardiology

## 2023-06-18 ENCOUNTER — Encounter: Payer: Self-pay | Admitting: Cardiology

## 2023-06-18 ENCOUNTER — Telehealth: Payer: Self-pay | Admitting: Cardiology

## 2023-06-18 VITALS — BP 146/90 | HR 81 | Ht 63.0 in | Wt 119.0 lb

## 2023-06-18 DIAGNOSIS — R9431 Abnormal electrocardiogram [ECG] [EKG]: Secondary | ICD-10-CM | POA: Diagnosis not present

## 2023-06-18 DIAGNOSIS — E785 Hyperlipidemia, unspecified: Secondary | ICD-10-CM

## 2023-06-18 DIAGNOSIS — R030 Elevated blood-pressure reading, without diagnosis of hypertension: Secondary | ICD-10-CM

## 2023-06-18 DIAGNOSIS — I4510 Unspecified right bundle-branch block: Secondary | ICD-10-CM | POA: Diagnosis not present

## 2023-06-18 NOTE — Patient Instructions (Signed)
Medication Instructions:  No Changes *If you need a refill on your cardiac medications before your next appointment, please call your pharmacy*   Lab Work: No Labs If you have labs (blood work) drawn today and your tests are completely normal, you will receive your results only by: MyChart Message (if you have MyChart) OR A paper copy in the mail If you have any lab test that is abnormal or we need to change your treatment, we will call you to review the results.   Testing/Procedures: [address]. Your physician has requested that you have an echocardiogram. Echocardiography is a painless test that uses sound waves to create images of your heart. It provides your doctor with information about the size and shape of your heart and how well your heart's chambers and valves are working. This procedure takes approximately one hour. There are no restrictions for this procedure. Please do NOT wear cologne, perfume, aftershave, or lotions (deodorant is allowed). Please arrive 15 minutes prior to your appointment time.   Dr. Bjorn Pippin has ordered a CT coronary calcium score.   Test locations:   [facility]   This is $99 out of pocket.   Coronary CalciumScan A coronary calcium scan is an imaging test used to look for deposits of calcium and other fatty materials (plaques) in the inner lining of the blood vessels of the heart (coronary arteries). These deposits of calcium and plaques can partly clog and narrow the coronary arteries without producing any symptoms or warning signs. This puts a person at risk for a heart attack. This test can detect these deposits before symptoms develop. Tell a health care provider about: Any allergies you have. All medicines you are taking, including vitamins, herbs, eye drops, creams, and over-the-counter medicines. Any problems you or family members have had with anesthetic medicines. Any blood disorders you have. Any surgeries  you have had. Any medical conditions you have. Whether you are pregnant or may be pregnant. What are the risks? Generally, this is a safe procedure. However, problems may occur, including: Harm to a pregnant woman and her unborn baby. This test involves the use of radiation. Radiation exposure can be dangerous to a pregnant woman and her unborn baby. If you are pregnant, you generally should not have this procedure done. Slight increase in the risk of cancer. This is because of the radiation involved in the test. What happens before the procedure? No preparation is needed for this procedure. What happens during the procedure? You will undress and remove any jewelry around your neck or chest. You will put on a hospital gown. Sticky electrodes will be placed on your chest. The electrodes will be connected to an electrocardiogram (ECG) machine to record a tracing of the electrical activity of your heart. A CT scanner will take pictures of your heart. During this time, you will be asked to lie still and hold your breath for 2-3 seconds while a picture of your heart is being taken. The procedure may vary among health care providers and hospitals. What happens after the procedure? You can get dressed. You can return to your normal activities. It is up to you to get the results of your test. Ask your health care provider, or the department that is doing the test, when your results will be ready. Summary A coronary calcium scan is an imaging test used to look for deposits of calcium and other fatty materials (plaques) in the inner lining of the blood vessels of  the heart (coronary arteries). Generally, this is a safe procedure. Tell your health care provider if you are pregnant or may be pregnant. No preparation is needed for this procedure. A CT scanner will take pictures of your heart. You can return to your normal activities after the scan is done. This information is not intended to replace  advice given to you by your health care provider. Make sure you discuss any questions you have with your health care provider. Document Released: 02/27/2008 Document Revised: 07/20/2016 Document Reviewed: 07/20/2016 Elsevier Interactive Patient Education  2017 [employer].  Follow-Up: At [facility], you and your health needs are our priority.  As part of our continuing mission to provide you with exceptional heart care, we have created designated Provider Care Teams.  These Care Teams include your primary Cardiologist (physician) and Advanced Practice Providers (APPs -  Physician Assistants and Nurse Practitioners) who all work together to provide you with the care you need, when you need it.  We recommend signing up for the patient portal called "MyChart".  Sign up information is provided on this After Visit Summary.  MyChart is used to connect with patients for Virtual Visits (Telemedicine).  Patients are able to view lab/test results, encounter notes, upcoming appointments, etc.  Non-urgent messages can be sent to your provider as well.   To learn more about what you can do with MyChart, go to [email or web address].    Your next appointment:   6 month(s)  Provider:   Epifanio Lesches, MD

## 2023-06-18 NOTE — Progress Notes (Signed)
Cardiology Office Note:    Date:  06/18/2023   ID:  Ricardo Chen, DOB [birth year], MRN [id number]  PCP:  Veryl Speak, FNP  Cardiologist:  None  Electrophysiologist:  None   Referring MD: Hillery Aldo, NP   Chief Complaint  Patient presents with   Abnormal ECG    History of Present Illness:    Ricardo Chen is a 72 y.o. male with a hx of hyperlipidemia, nephrolithiasis who is referred by Hillery Aldo, NP for evaluation of abnormal EKG.  Denies any chest pain, dyspnea, lightheadedness, syncope, lower extremity edema, or palpitations.  Reports he rides stationary bike at home as well as lifts weights and goes for walks, exercises at least 3-4 times per week.  Denies exertional symptoms.  He smoked for 10 years about 1 pack/week, quit age 36.  Family history includes mother died of heart failure at age 37.  Reports BP has been 120s-130s when he checks at home.    Past Medical History:  Diagnosis Date   Anxiety    High cholesterol    History of kidney stones    Left ureteral calculus     Past Surgical History:  Procedure Laterality Date   CYSTOSCOPY W/ URETERAL STENT PLACEMENT Bilateral 01/18/2020   Procedure: CYSTOSCOPY WITH BILATERAL RETROGRADE PYELOGRAM, left URETERAL STENT PLACEMENT, attempted right ureteral stent placement;  Surgeon: Malen Gauze, MD;  Location: [facility] OR;  Service: Urology;  Laterality: Bilateral;   CYSTOSCOPY WITH RETROGRADE PYELOGRAM, URETEROSCOPY AND STENT PLACEMENT Left 03/15/2020   Procedure: CYSTOSCOPY WITH RETROGRADE PYELOGRAM, URETEROSCOPY AND STENT PLACEMENT;  Surgeon: Malen Gauze, MD;  Location: [facility];  Service: Urology;  Laterality: Left;  1 HR   HOLMIUM LASER APPLICATION Left 03/15/2020   Procedure: HOLMIUM LASER APPLICATION;  Surgeon: Malen Gauze, MD;  Location[facility];  Service: Urology;  Laterality: Left;   TONSILLECTOMY  as child    Current Medications: Current Meds   Medication Sig   cholecalciferol (VITAMIN D3) 25 MCG (1000 UNIT) tablet Take 1,000 Units by mouth daily.   ferrous sulfate 325 (65 FE) MG tablet Take 325 mg by mouth daily with breakfast.   oxyCODONE-acetaminophen (PERCOCET/ROXICET) 5-325 MG tablet Take 1-2 tablets by mouth every 4 (four) hours as needed for moderate pain.   psyllium (METAMUCIL SMOOTH TEXTURE) 28 % packet Take 1 packet by mouth daily as needed (constipation).   tamsulosin (FLOMAX) 0.4 MG CAPS capsule TAKE 1 CAPSULE BY MOUTH EVERY DAY AFTER SUPPER   vitamin B-12 (CYANOCOBALAMIN) 1000 MCG tablet Take 1,000 mcg by mouth daily.   zinc gluconate 50 MG tablet Take 50 mg by mouth daily.     Allergies:   Patient has no known allergies.   Social History   Socioeconomic History   Marital status: Divorced    Spouse name: Not on file   Number of children: 2   Years of education: 16   Highest education level: Not on file  Occupational History   Occupation: Retired  Tobacco Use   Smoking status: Former    Current packs/day: 0.00    Average packs/day: 0.1 packs/day for 10.0 years (1.0 ttl pk-yrs)    Types: Cigarettes    Start date: 09/14/1969    Quit date: 09/15/1979    Years since quitting: 43.7   Smokeless tobacco: Never  Vaping Use   Vaping status: Never Used  Substance and Sexual Activity   Alcohol use: Not Currently    Alcohol/week: 2.0 standard drinks  of alcohol    Types: 2 Cans of beer per week   Drug use: No    Comment: Previous hx of marijuna usage   Sexual activity: Not Currently  Other Topics Concern   Not on file  Social History Narrative   Fun: Swim, boat, fish   Denies religious beliefs effecting health care.    Social Determinants of Health   Financial Resource Strain: Not on file  Food Insecurity: Not on file  Transportation Needs: Not on file  Physical Activity: Not on file  Stress: Not on file  Social Connections: Not on file     Family History: The patient's family history includes Colon  cancer in his father; Heart disease in his father; Heart failure in his mother.  ROS:   Please see the history of present illness.     All other systems reviewed and are negative.  EKGs/Labs/Other Studies Reviewed:    The following studies were reviewed today:   EKG:   06/18/2023: Normal sinus rhythm, rate 81, right bundle branch block  Recent Labs: No results found for requested labs within last 365 days.  Recent Lipid Panel    Component Value Date/Time   CHOL 310 (H) 01/14/2015 1223   TRIG 228.0 (H) 01/14/2015 1223   HDL 62.00 01/14/2015 1223   CHOLHDL 5 01/14/2015 1223   VLDL 45.6 (H) 01/14/2015 1223   LDLDIRECT 194.0 01/14/2015 1223    Physical Exam:    VS:  BP (!) 146/90   Pulse 81   Ht 5\' 3"  (1.6 m)   Wt 119 lb (54 kg)   SpO2 97%   BMI 21.08 kg/m     Wt Readings from Last 3 Encounters:  06/18/23 119 lb (54 kg)  03/15/20 122 lb 9.6 oz (55.6 kg)  03/01/20 123 lb 3.2 oz (55.9 kg)     GEN:  Well nourished, well developed in no acute distress HEENT: Normal NECK: No JVD; No carotid bruits LYMPHATICS: No lymphadenopathy CARDIAC: RRR, no murmurs, rubs, gallops RESPIRATORY:  Clear to auscultation without rales, wheezing or rhonchi  ABDOMEN: Soft, non-tender, non-distended MUSCULOSKELETAL:  No edema; No deformity  SKIN: Warm and dry NEUROLOGIC:  Alert and oriented x 3 PSYCHIATRIC:  Normal affect   ASSESSMENT:    1. Nonspecific abnormal electrocardiogram (ECG) (EKG)   2. Elevated BP without diagnosis of hypertension   3. Hyperlipidemia, unspecified hyperlipidemia type    PLAN:    Abnormal EKG: Right bundle branch block on EKG.  Check echocardiogram to evaluate for structural heart disease  Hyperlipidemia: On atorvastatin 20 mg every other day.  LDL 151 on 05/27/2023.  Increased to every day over last month.  Check calcium score to guide how aggressive to be in lowering cholesterol  Elevated BP without diagnosis of hypertension: BP elevated in clinic today  reports has been well-controlled when checks at home.  Asked to check BP twice daily for next week and let us know results  RTC in 4 months   Medication Adjustments/Labs and Tests Ordered: Current medicines are reviewed at length with the patient today.  Concerns regarding medicines are outlined above.  Orders Placed This Encounter  Procedures   CT CARDIAC SCORING (SELF PAY ONLY)   EKG 12-Lead   ECHOCARDIOGRAM COMPLETE   No orders of the defined types were placed in this encounter.   Patient Instructions  Medication Instructions:  No Changes *If you need a refill on your cardiac medications before your next appointment, please call your pharmacy*   Lab  Work: No Labs If you have labs (blood work) drawn today and your tests are completely normal, you will receive your results only by: [employer] (if you have MyChart) OR A paper copy in the mail If you have any lab test that is abnormal or we need to change your treatment, we will call you to review the results.   Testing/Procedures: [address]. Your physician has requested that you have an echocardiogram. Echocardiography is a painless test that uses sound waves to create images of your heart. It provides your doctor with information about the size and shape of your heart and how well your heart's chambers and valves are working. This procedure takes approximately one hour. There are no restrictions for this procedure. Please do NOT wear cologne, perfume, aftershave, or lotions (deodorant is allowed). Please arrive 15 minutes prior to your appointment time.   Dr. Bjorn Pippin has ordered a CT coronary calcium score.   Test locations:   [facility]   This is $99 out of pocket.   Coronary CalciumScan A coronary calcium scan is an imaging test used to look for deposits of calcium and other fatty materials (plaques) in the inner lining of the blood vessels of the heart (coronary arteries).  These deposits of calcium and plaques can partly clog and narrow the coronary arteries without producing any symptoms or warning signs. This puts a person at risk for a heart attack. This test can detect these deposits before symptoms develop. Tell a health care provider about: Any allergies you have. All medicines you are taking, including vitamins, herbs, eye drops, creams, and over-the-counter medicines. Any problems you or family members have had with anesthetic medicines. Any blood disorders you have. Any surgeries you have had. Any medical conditions you have. Whether you are pregnant or may be pregnant. What are the risks? Generally, this is a safe procedure. However, problems may occur, including: Harm to a pregnant woman and her unborn baby. This test involves the use of radiation. Radiation exposure can be dangerous to a pregnant woman and her unborn baby. If you are pregnant, you generally should not have this procedure done. Slight increase in the risk of cancer. This is because of the radiation involved in the test. What happens before the procedure? No preparation is needed for this procedure. What happens during the procedure? You will undress and remove any jewelry around your neck or chest. You will put on a hospital gown. Sticky electrodes will be placed on your chest. The electrodes will be connected to an electrocardiogram (ECG) machine to record a tracing of the electrical activity of your heart. A CT scanner will take pictures of your heart. During this time, you will be asked to lie still and hold your breath for 2-3 seconds while a picture of your heart is being taken. The procedure may vary among health care providers and hospitals. What happens after the procedure? You can get dressed. You can return to your normal activities. It is up to you to get the results of your test. Ask your health care provider, or the department that is doing the test, when your results  will be ready. Summary A coronary calcium scan is an imaging test used to look for deposits of calcium and other fatty materials (plaques) in the inner lining of the blood vessels of the heart (coronary arteries). Generally, this is a safe procedure. Tell your health care provider if you are pregnant or may be pregnant. No  preparation is needed for this procedure. A CT scanner will take pictures of your heart. You can return to your normal activities after the scan is done. This information is not intended to replace advice given to you by your health care provider. Make sure you discuss any questions you have with your health care provider. Document Released: 02/27/2008 Document Revised: 07/20/2016 Document Reviewed: 07/20/2016 Elsevier Interactive Patient Education  2017 [employer].  Follow-Up: At [facility], you and your health needs are our priority.  As part of our continuing mission to provide you with exceptional heart care, we have created designated Provider Care Teams.  These Care Teams include your primary Cardiologist (physician) and Advanced Practice Providers (APPs -  Physician Assistants and Nurse Practitioners) who all work together to provide you with the care you need, when you need it.  We recommend signing up for the patient portal called "MyChart".  Sign up information is provided on this After Visit Summary.  MyChart is used to connect with patients for Virtual Visits (Telemedicine).  Patients are able to view lab/test results, encounter notes, upcoming appointments, etc.  Non-urgent messages can be sent to your provider as well.   To learn more about what you can do with MyChart, go to [email or web address].    Your next appointment:   6 month(s)  Provider:   Epifanio Lesches, MD      Signed, Little Ishikawa, MD  06/18/2023 4:17 PM    [facility]

## 2023-06-18 NOTE — Telephone Encounter (Signed)
Scheduled NP appt with Dr. Bjorn Pippin for this afternoon at 3:40

## 2023-07-08 ENCOUNTER — Ambulatory Visit (HOSPITAL_COMMUNITY): Payer: Medicare (Managed Care) | Attending: Cardiology

## 2023-07-08 DIAGNOSIS — E785 Hyperlipidemia, unspecified: Secondary | ICD-10-CM | POA: Diagnosis present

## 2023-07-08 DIAGNOSIS — R9431 Abnormal electrocardiogram [ECG] [EKG]: Secondary | ICD-10-CM | POA: Diagnosis present

## 2023-07-08 DIAGNOSIS — R030 Elevated blood-pressure reading, without diagnosis of hypertension: Secondary | ICD-10-CM | POA: Diagnosis present

## 2023-07-08 LAB — ECHOCARDIOGRAM COMPLETE
Area-P 1/2: 2.79 cm2
S' Lateral: 2.1 cm

## 2023-07-16 ENCOUNTER — Ambulatory Visit (HOSPITAL_BASED_OUTPATIENT_CLINIC_OR_DEPARTMENT_OTHER): Payer: Medicare (Managed Care)

## 2023-08-05 ENCOUNTER — Other Ambulatory Visit (HOSPITAL_BASED_OUTPATIENT_CLINIC_OR_DEPARTMENT_OTHER): Payer: Medicare (Managed Care)

## 2024-05-11 LAB — AMB RESULTS CONSOLE CBG: Glucose: 148

## 2024-06-30 NOTE — Progress Notes (Signed)
 The patient attended a screening event on 05/11/2024 where his screening results were a BP of 133/79 and a blood glucose of 148. Per chart review  the patient has Dr. Lonni L. Kate as his PCP, has [employer] for insurance, and a medium risk tobacco SDOH from 2024. Chart review also indicates the patients last appt with his listed PCP was on 06/18/2023. [facility] attempted to reach patient for initial f/u (10/15, 10/16, 10/17) but was unable to reach the patient. Letter sent with [facility] and [facility] Primary care clinic PCP resource flyers, [place] and [employer] [facility] financial assistance information flyers in case needed by patient. An additional follow up will be done in according to the health equity team's protocol.

## 2024-08-31 NOTE — Progress Notes (Unsigned)
 The patient attended a screening event on 05/11/2024 where his screening results were a BP of 133/79 and a blood glucose of 148. Per chart review  the patient has Dr. Lonni L. Kate as his PCP, has [employer] for insurance, and a medium risk tobacco SDOH from 2024. Chart review also indicates the patients last appt with his listed PCP was on 06/18/2023. CHW attempted to reach patient for initial f/u (10/15, 10/16, 10/17) but was unable to reach the patient. Letter sent with [facility] and [facility] Primary care clinic PCP resource flyers, [place] and [facility] [facility] financial assistance information flyers in case needed by patient.   Post 60-day f/u CHW attempt to reach patient via mobile phone #. Letter sent with [employer] and [facility] Primary care clinic PCP resource information flyers, in case needed by patient. An additional follow up will be done in according to the health equity team's protocol.
